# Patient Record
Sex: Female | Born: 1985 | Race: White | Hispanic: No | Marital: Married | Smoking: Former smoker
Health system: Southern US, Community
[De-identification: ages and names within clinical notes are randomized; demographics above are authoritative.]

## PROBLEM LIST (undated history)

## (undated) DIAGNOSIS — N83202 Unspecified ovarian cyst, left side: Secondary | ICD-10-CM

## (undated) DIAGNOSIS — E669 Obesity, unspecified: Secondary | ICD-10-CM

## (undated) DIAGNOSIS — L732 Hidradenitis suppurativa: Secondary | ICD-10-CM

## (undated) DIAGNOSIS — F419 Anxiety disorder, unspecified: Secondary | ICD-10-CM

## (undated) HISTORY — DX: Unspecified ovarian cyst, left side: N83.202

## (undated) HISTORY — DX: Anxiety disorder, unspecified: F41.9

## (undated) HISTORY — DX: Hidradenitis suppurativa: L73.2

## (undated) HISTORY — DX: Obesity, unspecified: E66.9

---

## 2006-05-04 ENCOUNTER — Ambulatory Visit: Payer: Self-pay | Admitting: Internal Medicine

## 2007-02-09 IMAGING — CR DG CHEST 2V
1 series · 2 of 2 positions shown · non-contrast
Comparison: none

REASON FOR EXAM: Shortness of breath
COMMENTS:

PROCEDURE:     DXR - DXR CHEST PA (OR AP) AND LATERAL  - May 04, 2006  [DATE]
RESULT:     PA and lateral view reveals the cardiomediastinal structures to
be within normal limits.  The lung fields are clear.  The vascularity is
within normal limits.  No effusions or pneumothoraces.

[Series 1: view not recorded · 0.17mm/px · 2 of 2 slices shown]
[im 1/2]
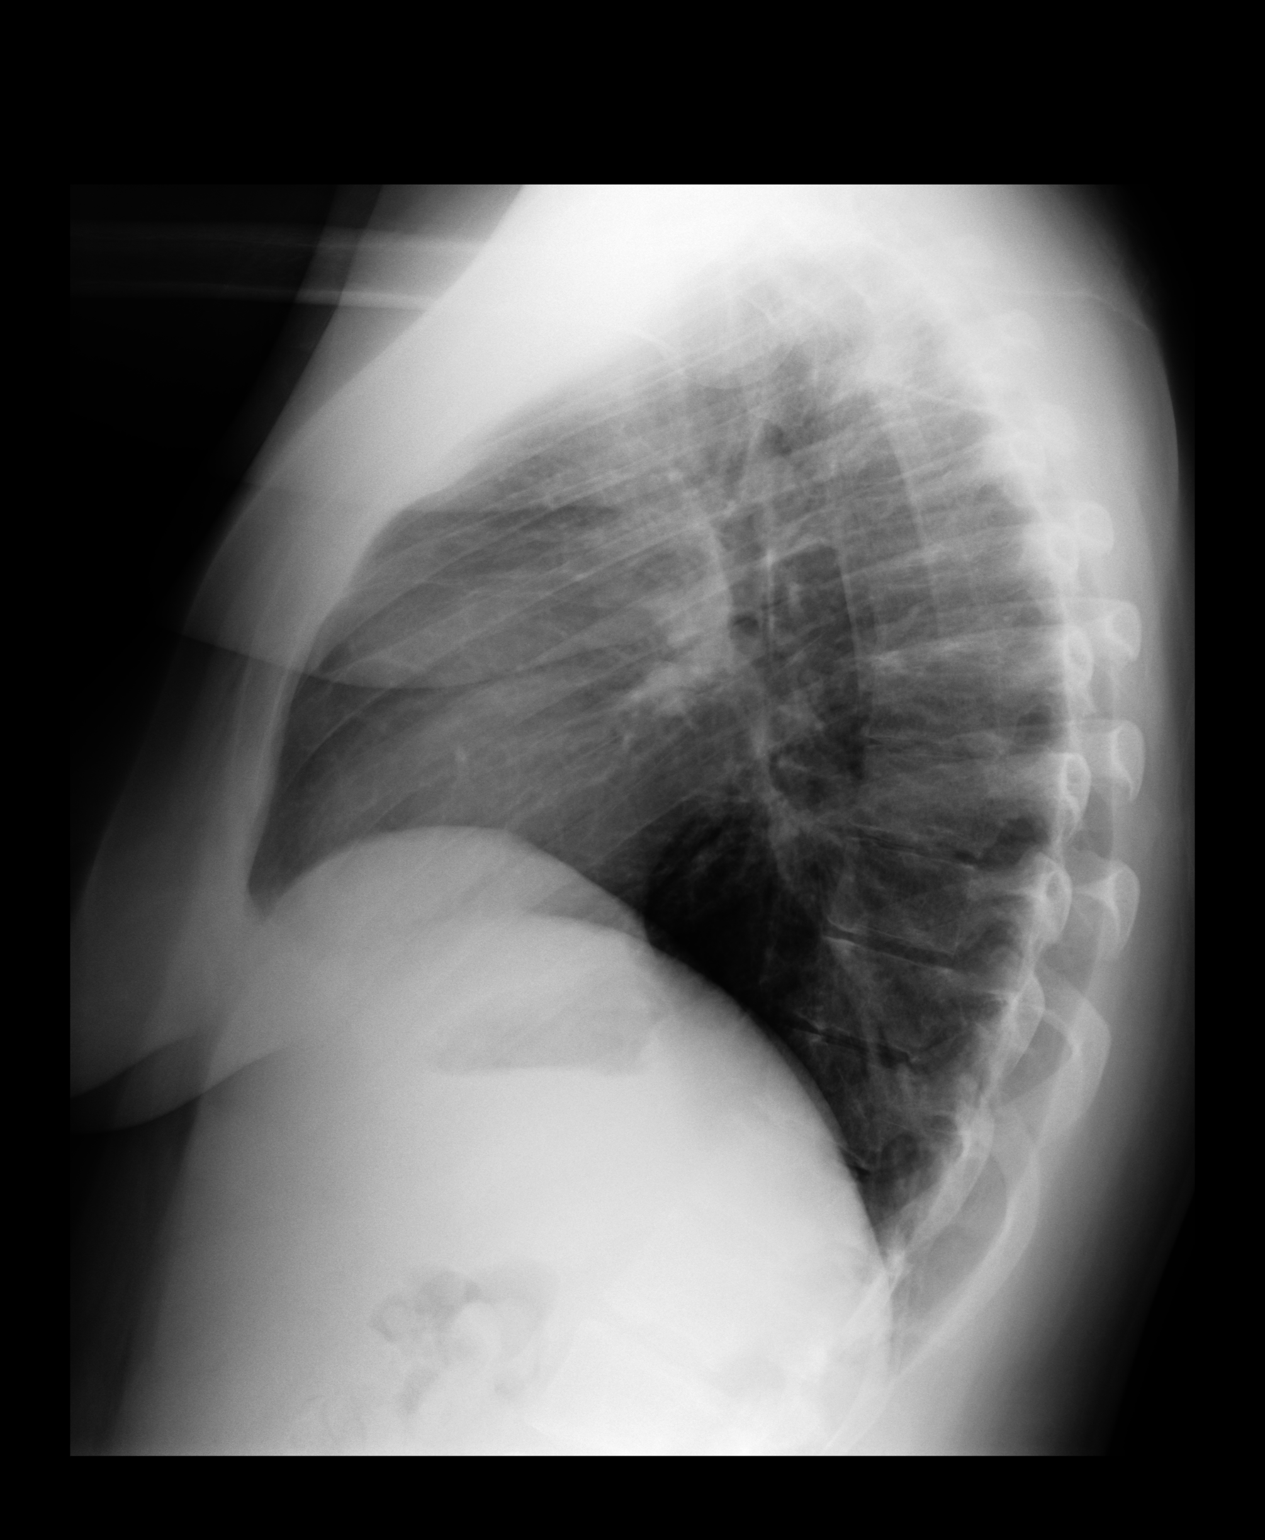
[im 2/2]
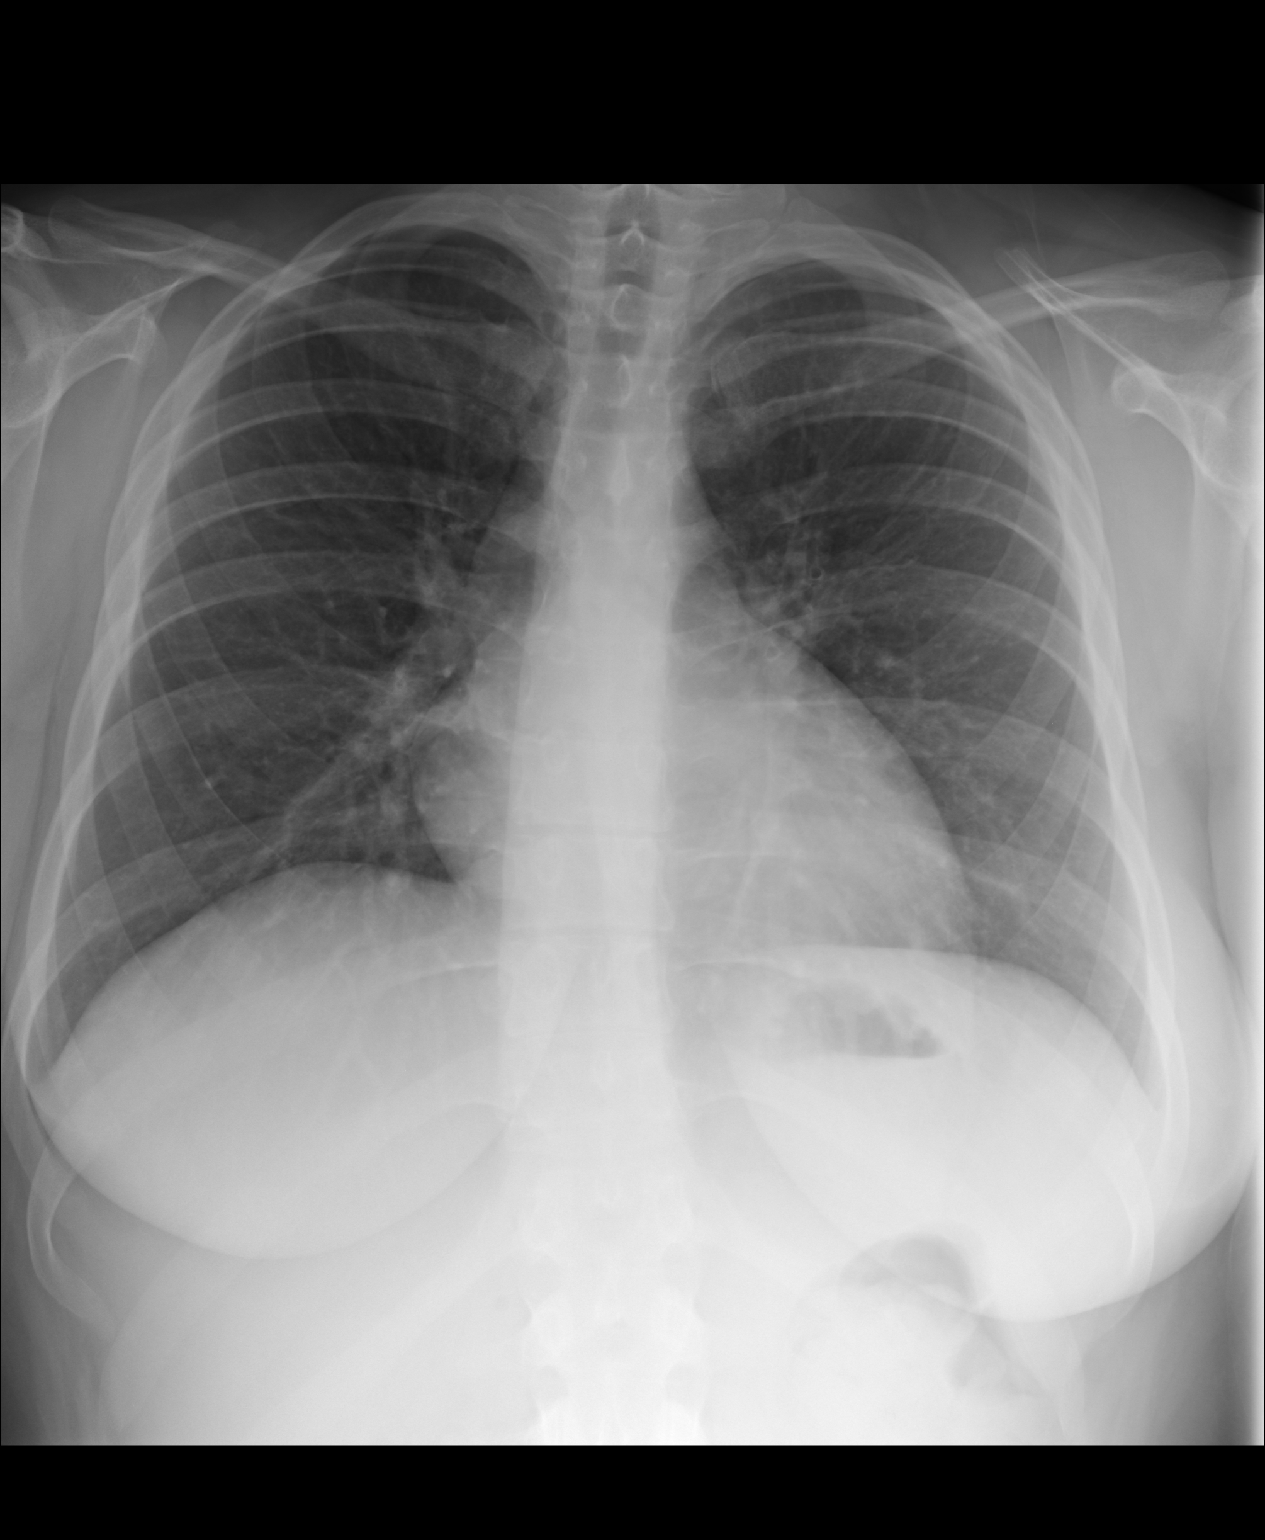

[2 of 2 positions shown; findings below may reference images not displayed]

IMPRESSION: No significant abnormalities are seen of the chest.

## 2009-10-05 HISTORY — PX: CHOLECYSTECTOMY, LAPAROSCOPIC: SHX56

## 2010-11-12 ENCOUNTER — Ambulatory Visit: Payer: Self-pay | Admitting: Emergency Medicine

## 2010-11-13 ENCOUNTER — Ambulatory Visit: Payer: Self-pay | Admitting: Emergency Medicine

## 2011-08-20 IMAGING — US ABDOMEN ULTRASOUND
1 series · 17 of 25 positions shown · non-contrast
Comparison: none

REASON FOR EXAM: abd pain possible gallstones  CR  [phone]   [id number]
COMMENTS:

[Series 1: abdomen ultrasound · 17 of 44 slices shown]
[im 1/44]
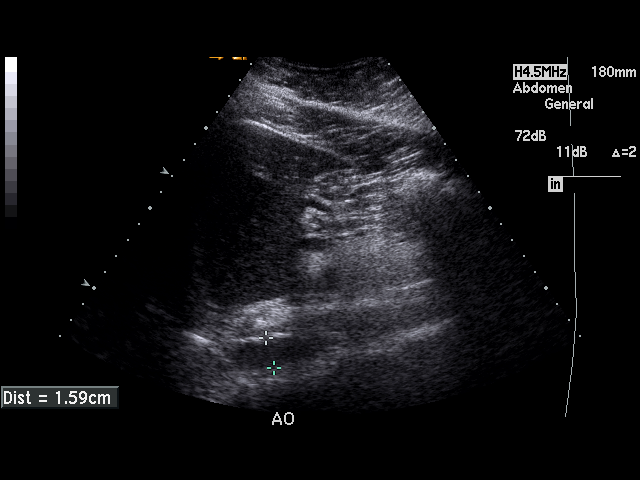
[im 4/44]
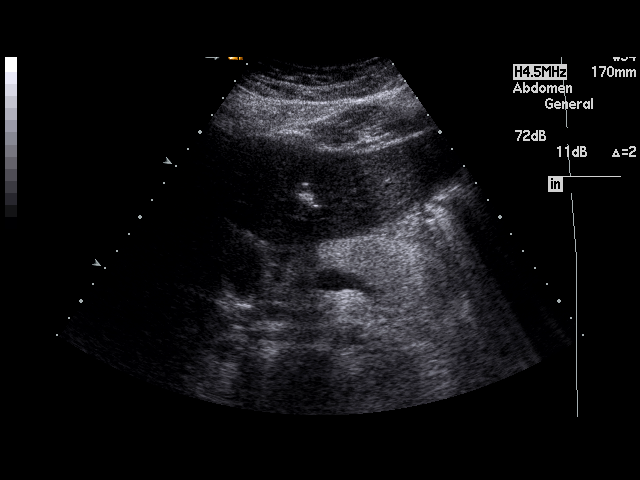
[im 6/44]
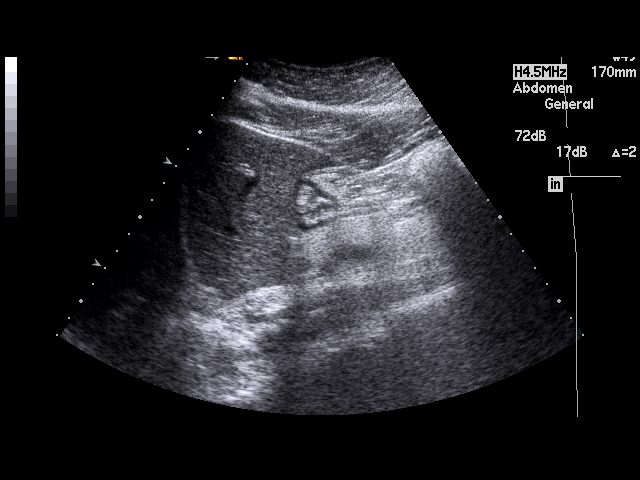
[im 9/44]
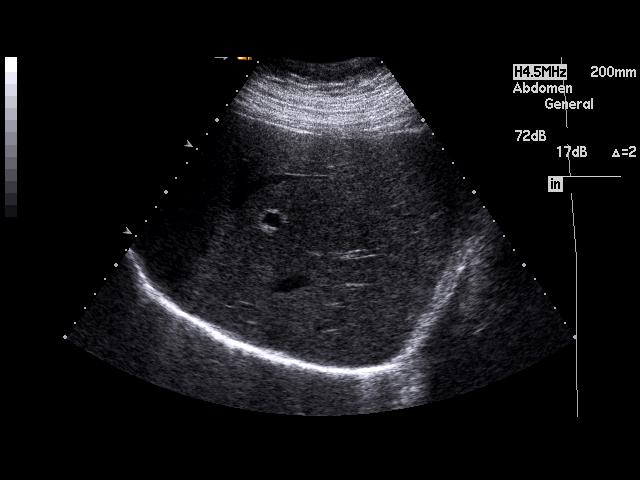
[im 11/44]
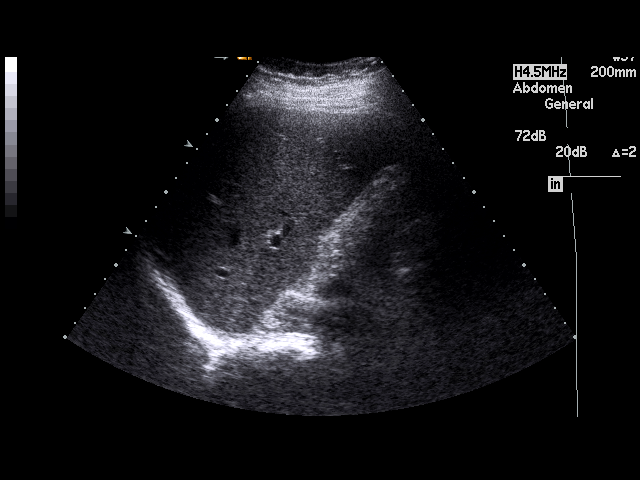
[im 15/44]
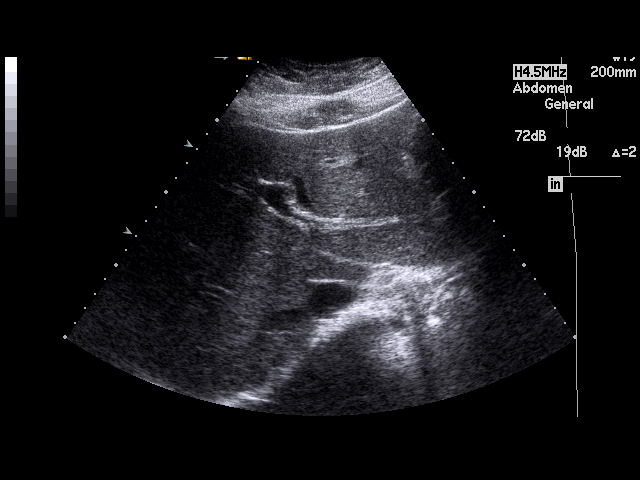
[im 17/44]
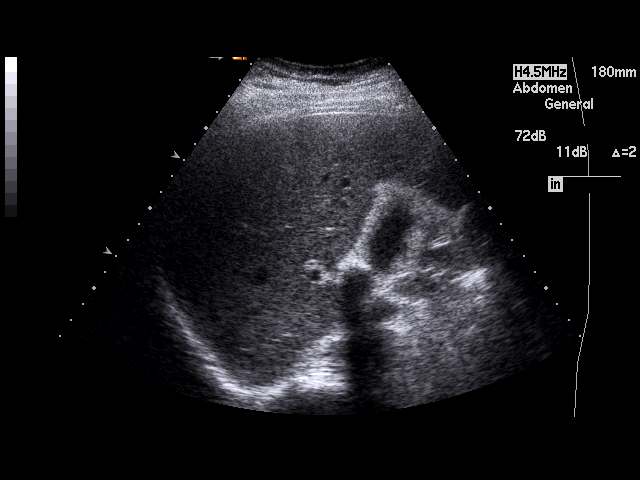
[im 20/44]
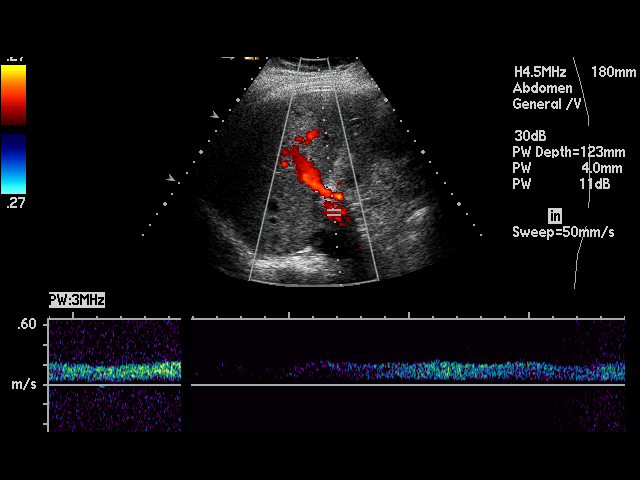
[im 22/44]
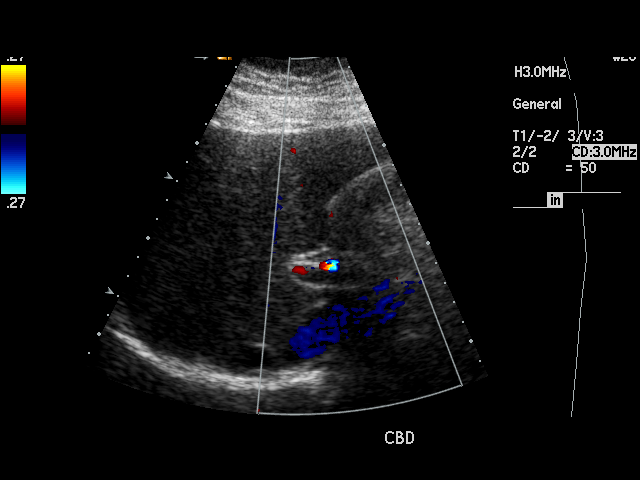
[im 24/44]
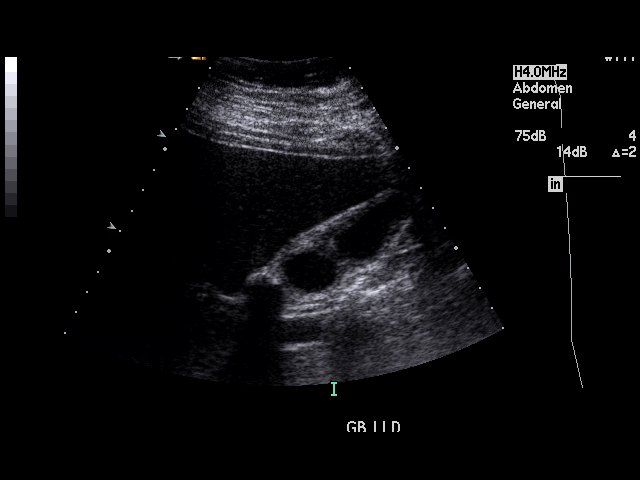
[im 27/44]
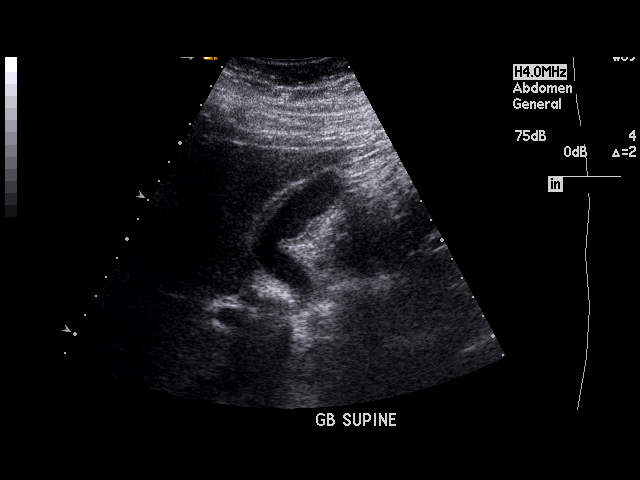
[im 29/44]
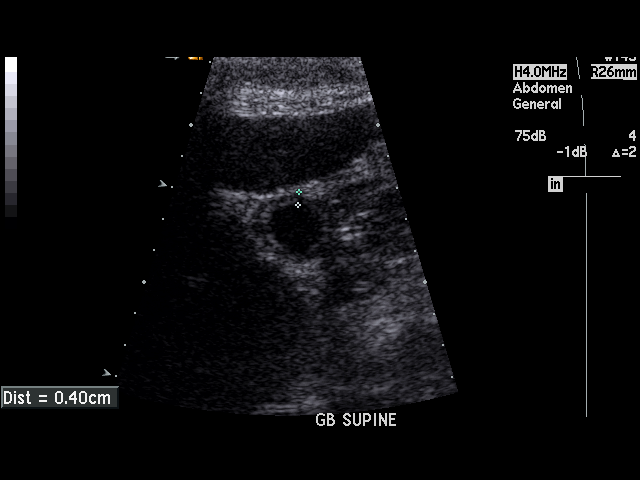
[im 33/44]
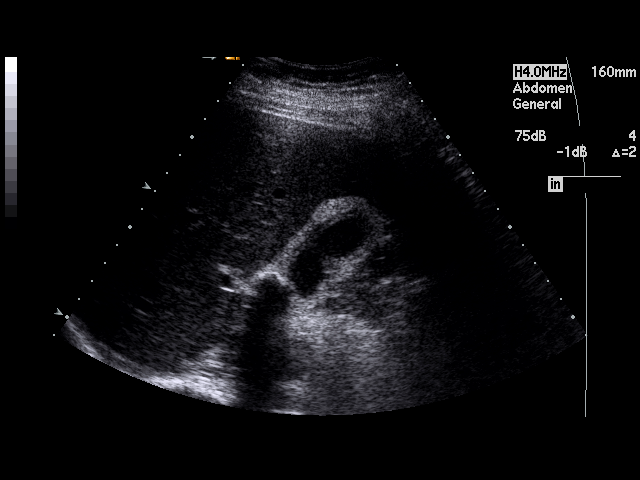
[im 35/44]
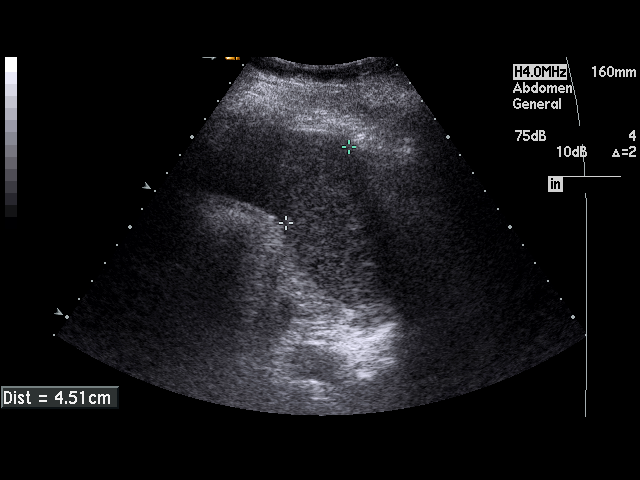
[im 38/44]
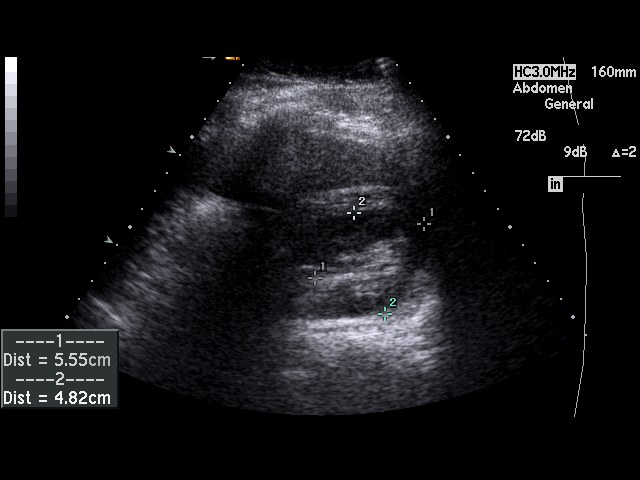
[im 40/44]
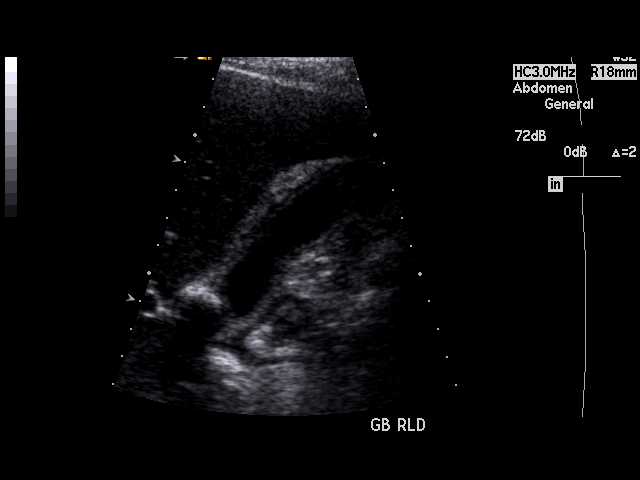
[im 44/44]
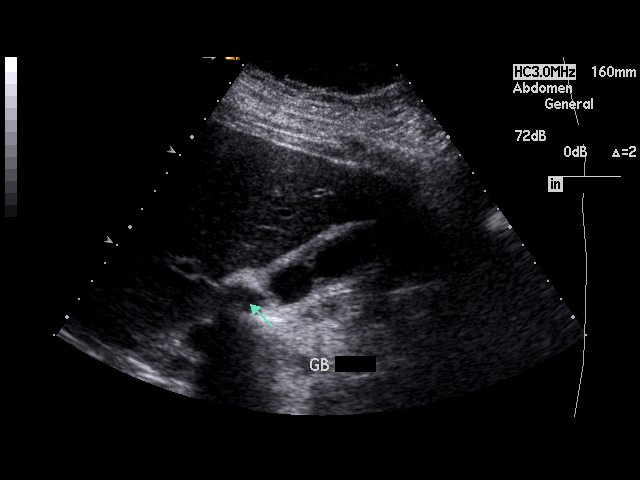

[17 of 25 positions shown; findings below may reference images not displayed]

PROCEDURE:     US  - US ABDOMEN GENERAL SURVEY  - November 12, 2010  [DATE]

RESULT:     Ultrasound of the abdomen is performed in the standard fashion
area study demonstrates what appears to be an impacted stone with shadowing
in the neck of the gallbladder. This did not change position with changes in
patient position during the exam. The gallbladder wall is thickened to
mm. There was not, however, a sonographic Murphy's sign. Correlate for acute
cholecystitis with cholelithiasis.

There is limited visualization of the pancreas because of overlying bowel
gas. The hepatic echotexture is normal. The portal venous flow is normal.
The common bile duct diameter 4.2 mm. The right kidney length is 10.2 cm.
The left kidney length is 10.6 cm. The spleen, aorta and inferior vena cava
appear unremarkable.
IMPRESSION: Findings suggestive of cholelithiasis acute cholecystitis.
There appears to be an impacted gallbladder stone in the gallbladder neck.
Other findings as above.(*)

## 2012-01-03 ENCOUNTER — Ambulatory Visit: Payer: Self-pay | Admitting: Family Medicine

## 2013-05-02 LAB — HM PAP SMEAR: HM Pap smear: NORMAL

## 2013-05-15 ENCOUNTER — Ambulatory Visit: Payer: Self-pay | Admitting: Obstetrics and Gynecology

## 2013-08-30 ENCOUNTER — Ambulatory Visit: Payer: Self-pay | Admitting: Obstetrics and Gynecology

## 2013-08-30 LAB — HEMOGLOBIN: HGB: 12.9 g/dL (ref 12.0–16.0)

## 2013-09-04 ENCOUNTER — Ambulatory Visit: Payer: Self-pay | Admitting: Obstetrics and Gynecology

## 2013-09-04 HISTORY — PX: HYSTEROSCOPY: SHX211

## 2013-09-04 HISTORY — PX: DILATION AND CURETTAGE OF UTERUS: SHX78

## 2013-09-05 LAB — PATHOLOGY REPORT

## 2013-11-03 ENCOUNTER — Ambulatory Visit: Payer: Self-pay | Admitting: Physician Assistant

## 2014-02-20 IMAGING — US TRANSABDOMINAL ULTRASOUND OF PELVIS
1 series · 13 of 25 positions shown · non-contrast
Comparison: none

REASON FOR EXAM: dysfunctional  uterine bleeding
COMMENTS:

[Series 1: transabdominal ultrasound of pelvis · 0.28mm/px · 13 of 108 slices shown]
[im 1/108]
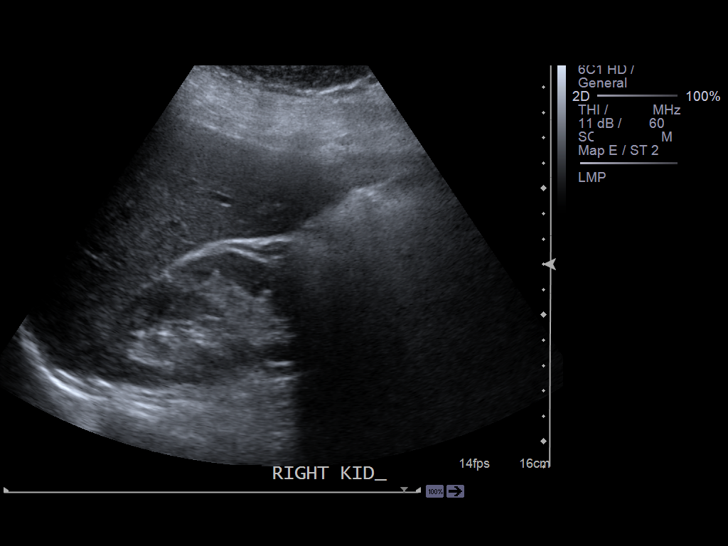
[im 9/108]
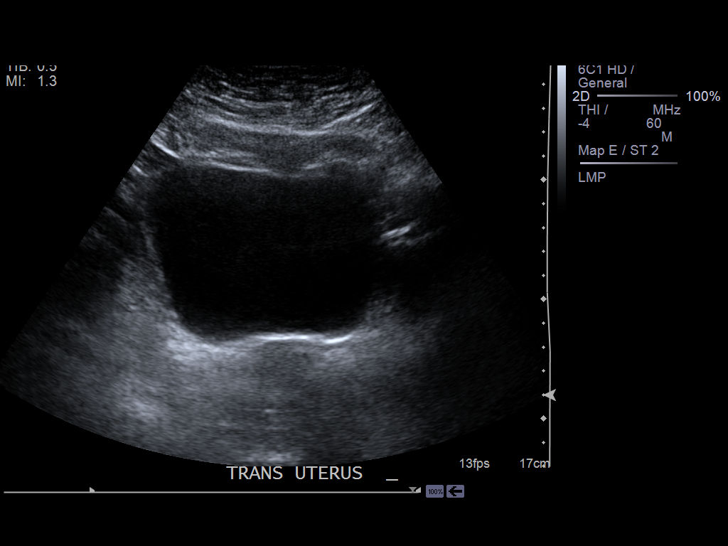
[im 18/108]
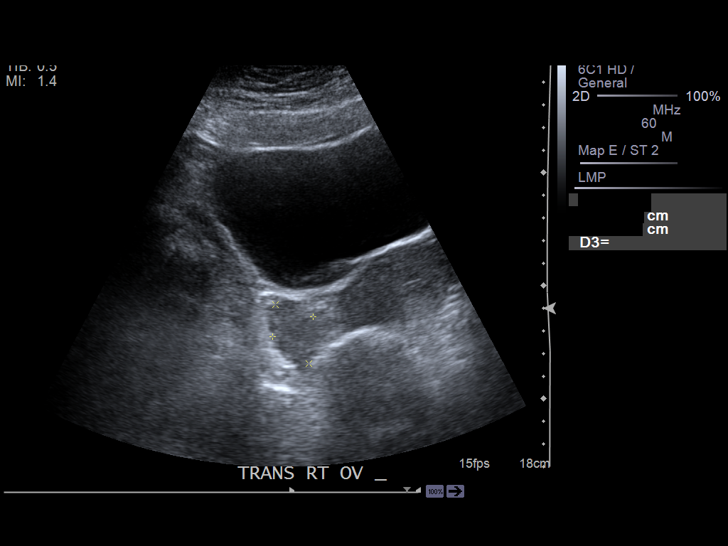
[im 27/108]
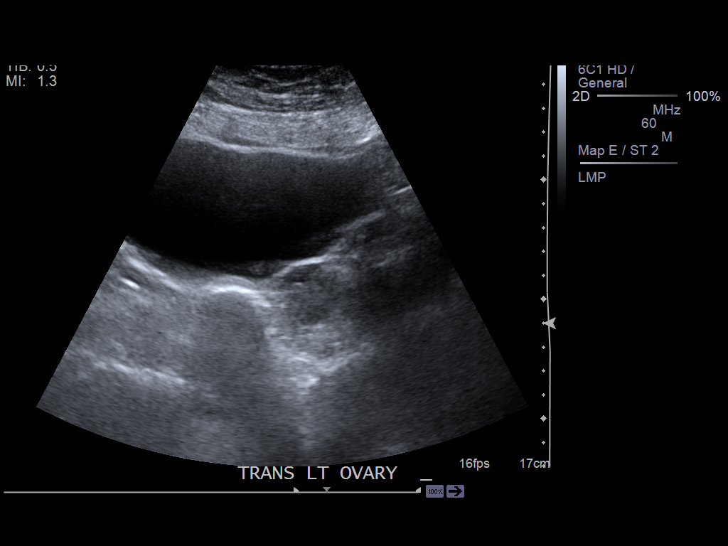
[im 36/108]
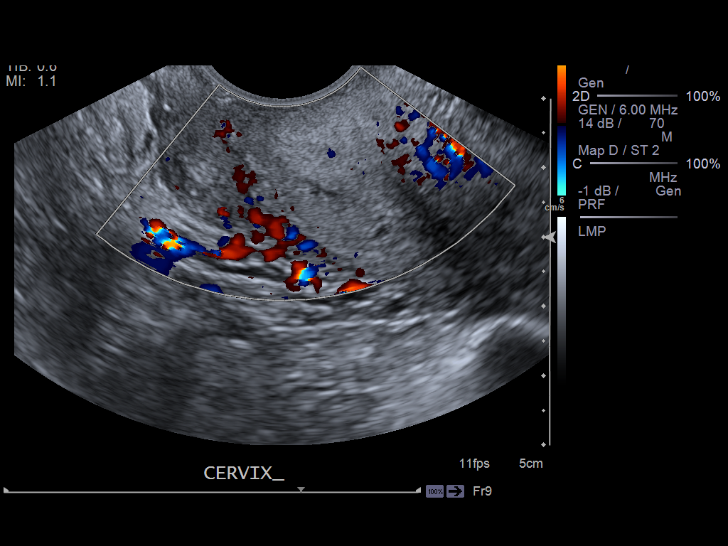
[im 45/108]
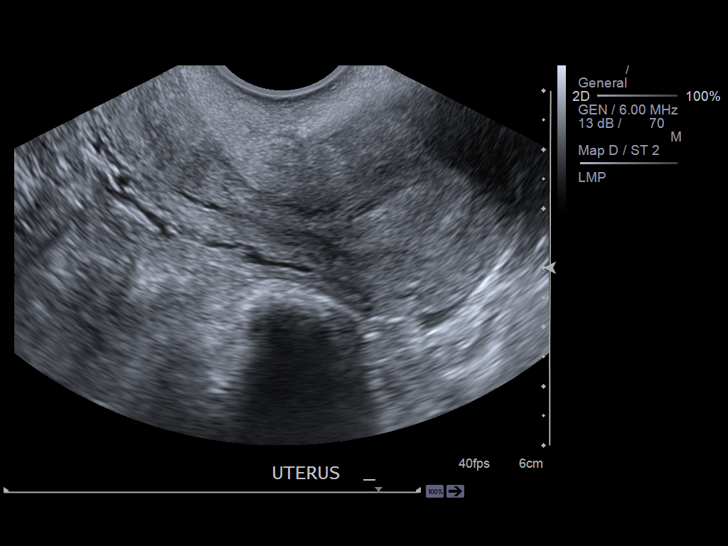
[im 54/108]
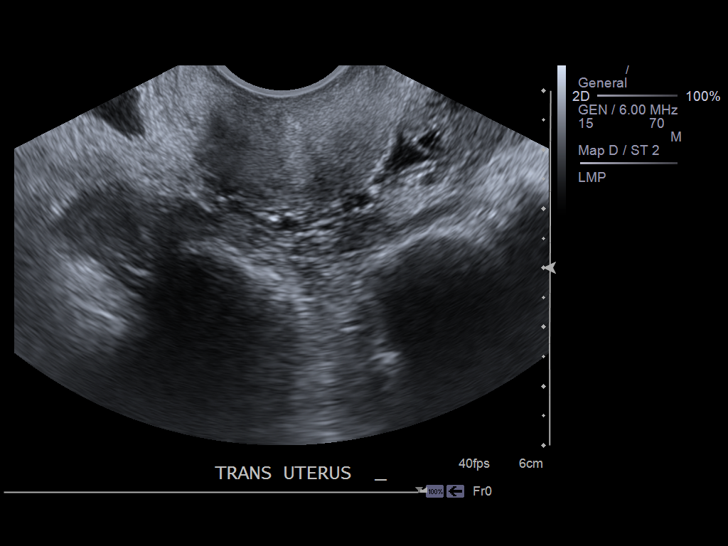
[im 63/108]
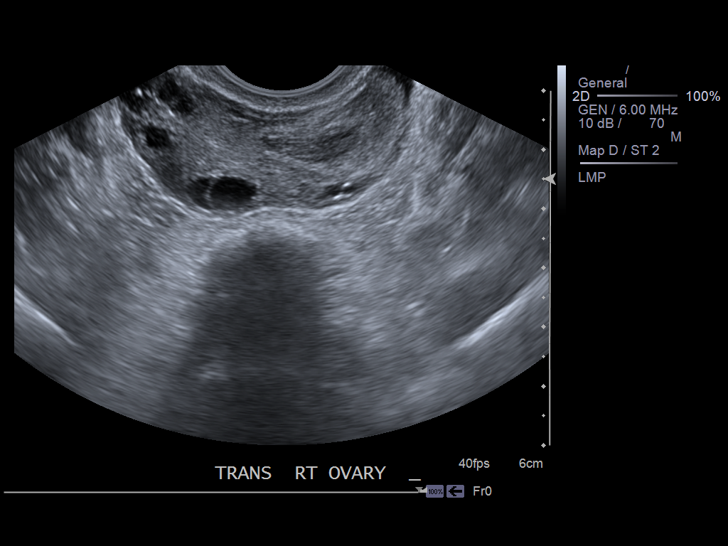
[im 72/108]
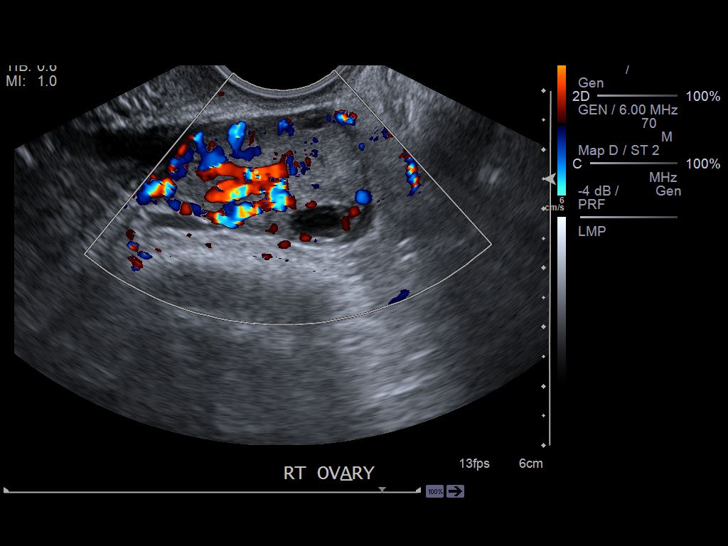
[im 81/108]
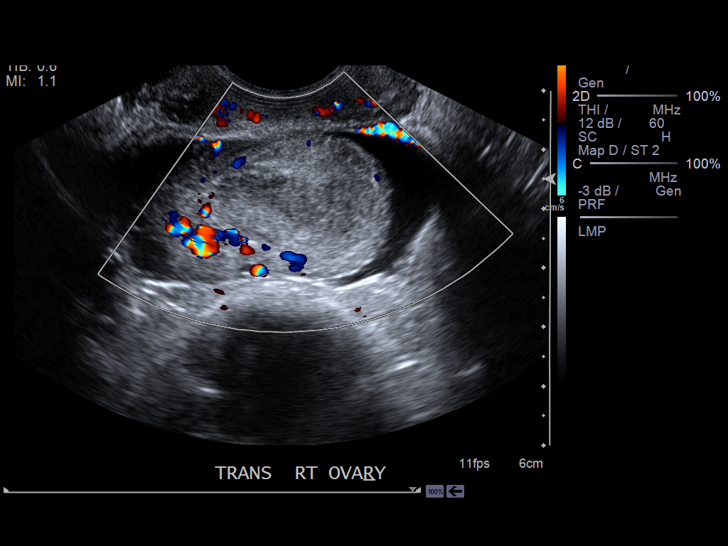
[im 90/108]
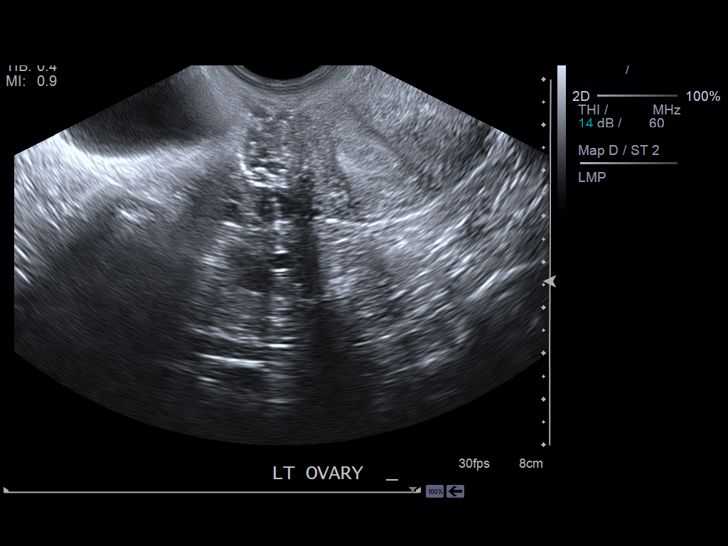
[im 99/108]
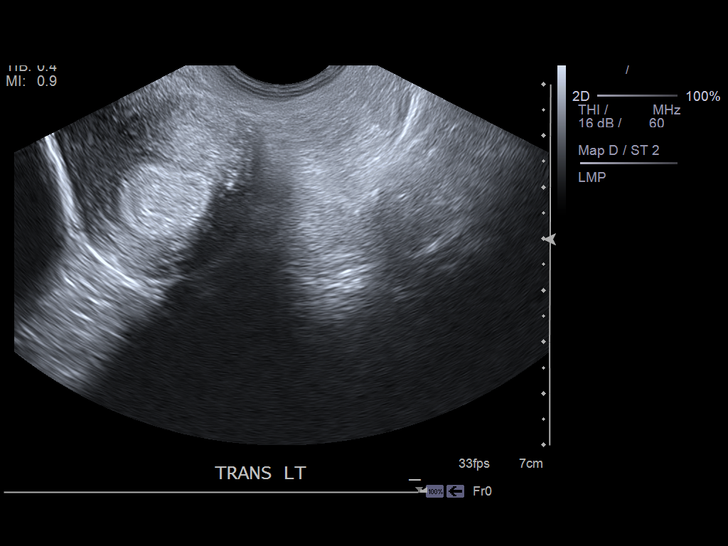
[im 108/108]
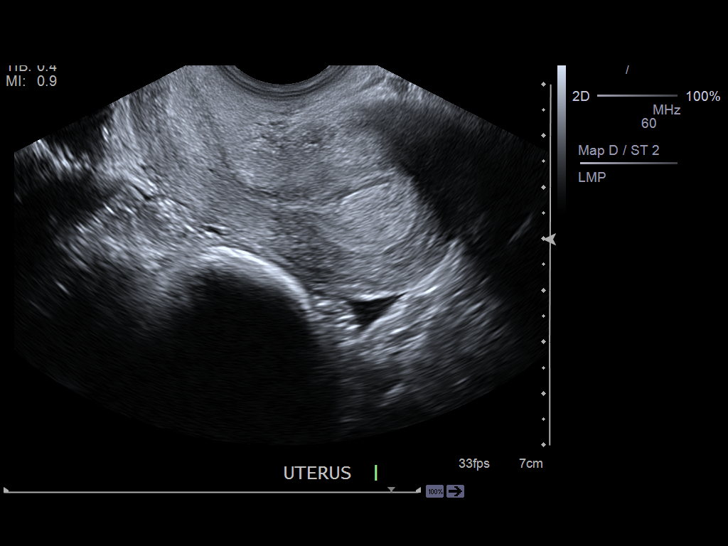

[13 of 25 positions shown; findings below may reference images not displayed]

PROCEDURE:     GEDOR - GEDOR PELVIS NON-OB W/TRANSVAGINAL  - May 15, 2013  [DATE]

RESULT:     A transabdominal and endovaginal pelvic sonogram is performed.
The patient has no previous exam for comparison. The kidneys appear to be
grossly normal on survey images. The uterus measures 8.18 x 4.94 x 3.71 cm.
There is a moderate amount of free fluid in the rectouterine cul-de-sac. The
endometrium measures 1.33 cm thick. The right ovary measures 4.74 x 2.05 x
3.40 cm. The left ovary measures 2.79 x 2.20 x 2.62 cm. No myometrial mass
is evident. There is a trace of fluid noted in the cervix. There is
increased vascularity in the right adnexa. The right ovary shows a mildly
hyperechoic lesion measuring 3.04 x 1.88 x 3.03 cm. This appear to have some
flow within it. There is relative hypoechogenicity in the central portion of
this. This could represent an endometrioma or other heterogeneous solid
lesion. Correlate clinically. The left ovary appears unremarkable.
IMPRESSION: 1. Moderate amount of free fluid. Abnormal mixed predominately hypoechoic
lesion in the right ovary with some surrounding fluid and increased blood
flow. Is there history of endometriosis?

[REDACTED]

## 2014-10-07 ENCOUNTER — Inpatient Hospital Stay: Payer: Self-pay | Admitting: Obstetrics and Gynecology

## 2014-10-07 LAB — CBC WITH DIFFERENTIAL/PLATELET
BASOS ABS: 0 10*3/uL (ref 0.0–0.1)
BASOS PCT: 0.4 %
Eosinophil #: 0.1 10*3/uL (ref 0.0–0.7)
Eosinophil %: 0.7 %
HCT: 37.7 % (ref 35.0–47.0)
HGB: 12.3 g/dL (ref 12.0–16.0)
LYMPHS ABS: 2.5 10*3/uL (ref 1.0–3.6)
LYMPHS PCT: 21.7 %
MCH: 31.1 pg (ref 26.0–34.0)
MCHC: 32.7 g/dL (ref 32.0–36.0)
MCV: 95 fL (ref 80–100)
MONOS PCT: 5.6 %
Monocyte #: 0.7 x10 3/mm (ref 0.2–0.9)
Neutrophil #: 8.4 10*3/uL — ABNORMAL HIGH (ref 1.4–6.5)
Neutrophil %: 71.6 %
Platelet: 324 10*3/uL (ref 150–440)
RBC: 3.96 10*6/uL (ref 3.80–5.20)
RDW: 13.7 % (ref 11.5–14.5)
WBC: 11.7 10*3/uL — ABNORMAL HIGH (ref 3.6–11.0)

## 2014-10-09 LAB — HEMATOCRIT: HCT: 32.1 % — ABNORMAL LOW (ref 35.0–47.0)

## 2015-01-25 NOTE — Op Note (Signed)
PATIENT NAME:  Jacqueline Abbott, Maylynn A MR#:  [id number] DATE OF BIRTH:  Feb 04, 1986  DATE OF PROCEDURE:  09/04/2013  PREOPERATIVE DIAGNOSES:  1.  Heavy menstrual bleeding. 2.  Endometrial polyp. 3.  Septate uterus.  POSTOPERATIVE DIAGNOSES:  1.  Heavy menstrual bleeding. 2.  Endometrial polyp. 3.  Septate uterus.  PROCEDURE PERFORMED:  1.  Hysteroscopy. 2.  Dilation and curettage with endometrial polypectomy.  ANESTHESIA: General endotracheal.  SURGEON: Hildred LaserAnika Alonie Gazzola, MD   ESTIMATED BLOOD LOSS: Minimal.  OPERATIVE FLUIDS: 100 mL of normal saline.  URINE OUTPUT: 150 mL.  COMPLICATIONS: None.  FINDINGS: Proliferative endometrium, suspected endometrial polyp on right lateral uterine wall that was approximately 1 to 2 cm.  There was a thickened anterior uterine wall near fundus, likely secondary to septation.  SPECIMENS: Endometrial curettings and suspected endometrial polyp.  PROCEDURE: The patient was taken to the Operating Room, where she was placed under general anesthesia without difficulty. She was then placed in the dorsal lithotomy position, using candy-cane stirrups. She was prepped and draped in the normal sterile fashion. A straight catheterization was performed. Next, a sterile speculum was inserted into the vagina. A single-tooth tenaculum was used to grasp the anterior lip of the cervix. Cervical dilation was performed. Next, the cavity was gently sounded to approximately 9 cm.  A 5 mm hysteroscope was then introduced into the uterus under direct visualization. The cavity was allowed to fill, and then the entire cavity was explored, with the findings described above. The hysteroscope was then removed, and a gentle sharp curettage was then passed into the uterus for endometrial sampling and removal of the suspected polyp.  After this, the hysteroscope was then reinserted into the uterine cavity and allowed to fill once more. The endometrial polyp was able to be better  visualized, and then the hysteroscope was removed from the uterine cavity and polyp forceps were then inserted into the uterine cavity in an attempt to grasp the endometrial polyp. After the polyp forceps were removed, the hysteroscope was then introduced once more into the uterine cavity. No evidence of the endometrial polyp was noted at this time.  The hysteroscope was once again removed from the uterine cavity. The tenaculum was removed from the anterior portion of the cervix, and excellent hemostasis was noted. The speculum was then removed from the patient's vagina. Instrument and sponge counts were correct x2. The patient was taken to the recovery room in stable condition.    ____________________________ Jacques EarthlyAnika S. Valentino Saxonherry, MD asc:cg D: 09/04/2013 17:39:01 ET T: 09/04/2013 23:29:51 ET JOB#: [id number]  cc: Jacques EarthlyAnika S. Valentino Saxonherry, MD, <Dictator> Fabian NovemberANIKA S Teshawn Moan MD ELECTRONICALLY SIGNED 09/05/2013 16:24

## 2015-01-29 DIAGNOSIS — E7800 Pure hypercholesterolemia, unspecified: Secondary | ICD-10-CM

## 2015-01-29 DIAGNOSIS — L732 Hidradenitis suppurativa: Secondary | ICD-10-CM | POA: Insufficient documentation

## 2015-01-29 DIAGNOSIS — K219 Gastro-esophageal reflux disease without esophagitis: Secondary | ICD-10-CM | POA: Insufficient documentation

## 2015-01-29 DIAGNOSIS — F419 Anxiety disorder, unspecified: Secondary | ICD-10-CM

## 2015-02-12 NOTE — H&P (Signed)
L&D Evaluation:  History:  HPI 29 yo G1P0, estimated date of confinement 09/30/2014, EGA 41.0 weeks admitted for Cytotec/Pitocin IOL.   Patient's Medical History Increased BMI; Anxiety;Tobacco user; Hidradenitisl;GERD   Patient's Surgical History D&C   Medications Pre Natal Vitamins  Zyrtec   Allergies NKDA   Social History tobacco  1 PPD   Family History Non-Contributory   ROS:  ROS All systems were reviewed.  HEENT, CNS, GI, GU, Respiratory, CV, Renal and Musculoskeletal systems were found to be normal.   Exam:  Vital Signs stable   Urine Protein not completed   General no apparent distress   Mental Status clear   Heart normal sinus rhythm   Abdomen gravid, non-tender   Estimated Fetal Weight Average for gestational age, 8 1/2 pounds   Back no CVAT   Edema 1+   Pelvic no external lesions, FT/90/-3/VTX/BOWI   FHT normal rate with no decels   Ucx irregular   Skin dry   Lymph no lymphadenopathy   Other O+/ATB-/NR/RI/VI/HIV-/GBS-   Impression:  Impression 41 week Intrauterine pregnancy   Plan:  Plan Cytotec/Pitocin IOL   Electronic Signatures: DeFrancesco, Prentice DockerMartin A (MD)  (Signed 04-Jan-16 06:05)  Authored: L&D Evaluation   Last Updated: 04-Jan-16 06:05 by DeFrancesco, Prentice DockerMartin A (MD)

## 2015-03-22 ENCOUNTER — Encounter: Payer: Self-pay | Admitting: Obstetrics and Gynecology

## 2015-04-12 ENCOUNTER — Ambulatory Visit (INDEPENDENT_AMBULATORY_CARE_PROVIDER_SITE_OTHER): Payer: No Typology Code available for payment source | Admitting: Obstetrics and Gynecology

## 2015-04-12 ENCOUNTER — Encounter: Payer: Self-pay | Admitting: Obstetrics and Gynecology

## 2015-04-12 VITALS — BP 121/84 | HR 92 | Ht 67.0 in | Wt 277.3 lb

## 2015-04-12 DIAGNOSIS — Z01419 Encounter for gynecological examination (general) (routine) without abnormal findings: Secondary | ICD-10-CM | POA: Diagnosis not present

## 2015-04-12 MED ORDER — NORGESTIMATE-ETH ESTRADIOL 0.25-35 MG-MCG PO TABS: 1.0000 | ORAL_TABLET | Freq: Every day | ORAL | Status: DC

## 2015-04-12 MED ORDER — CITALOPRAM HYDROBROMIDE 20 MG PO TABS: 20.0000 mg | ORAL_TABLET | Freq: Every day | ORAL | Status: DC

## 2015-04-12 MED ORDER — CYANOCOBALAMIN 1000 MCG/ML IJ SOLN: 1000.0000 ug | Freq: Once | INTRAMUSCULAR | Status: DC

## 2015-04-12 NOTE — Progress Notes (Signed)
  Subjective:     Jacqueline Abbott is a 29 y.o. female and is here for a comprehensive physical exam. The patient reports problems - depression worse in last month- desires restart medication; also weight has went up due to depression.  History   Social History  . Marital Status: Married    Spouse Name: N/A  . Number of Children: N/A  . Years of Education: N/A   Occupational History  . Not on file.   Social History Main Topics  . Smoking status: Current Every Day Smoker    Types: Cigarettes  . Smokeless tobacco: Never Used  . Alcohol Use: No  . Drug Use: No  . Sexual Activity: Yes    Birth Control/ Protection: Pill   Other Topics Concern  . Not on file   Social History Narrative   Health Maintenance  Topic Date Due  . HIV Screening  12/30/2001  . PAP SMEAR  12/30/2004  . TETANUS/TDAP  12/30/2005  . INFLUENZA VACCINE  05/06/2015    The following portions of the patient's history were reviewed and updated as appropriate: allergies, current medications, past family history, past medical history, past social history, past surgical history and problem list.  Review of Systems A comprehensive review of systems was negative except for: Behavioral/Psych: positive for anxiety, depression and obesity   Objective:    General appearance: alert, cooperative and morbidly obese Throat: lips, mucosa, and tongue normal; teeth and gums normal Lungs: clear to auscultation bilaterally Breasts: normal appearance, no masses or tenderness Heart: regular rate and rhythm, S1, S2 normal, no murmur, click, rub or gallop Abdomen: soft, non-tender; bowel sounds normal; no masses,  no organomegaly Pelvic: cervix normal in appearance, external genitalia normal, no adnexal masses or tenderness, no cervical motion tenderness, rectovaginal septum normal, uterus normal size, shape, and consistency and vagina normal without discharge Extremities: extremities normal, atraumatic, no cyanosis or  edema Neurologic: Alert and oriented X 3, normal strength and tone. Normal symmetric reflexes. Normal coordination and gait    Assessment:    Healthy female exam. Depression; morbid obesity; smoker;      Plan:  Routine screening labs and pap. RTC 1 year for AE  Restart celexa at 20mg  daily.  RTC in 2 weeks to start weight loss management program, B12 rx sent today and will give Adipex then, already counseled.  Smoking cessation encouraged, she will let me know when she is ready to start and I will prescribe Nicotrol patches.  Melody Ines BloomerBurr, CNM   See After Visit Summary for Counseling Recommendations

## 2015-04-12 NOTE — Patient Instructions (Signed)
Exercise to Lose Weight Exercise and a healthy diet may help you lose weight. Your doctor may suggest specific exercises. EXERCISE IDEAS AND TIPS  Choose low-cost things you enjoy doing, such as walking, bicycling, or exercising to workout videos.  Take stairs instead of the elevator.  Walk during your lunch break.  Park your car further away from work or school.  Go to a gym or an exercise class.  Start with 5 to 10 minutes of exercise each day. Build up to 30 minutes of exercise 4 to 6 days a week.  Wear shoes with good support and comfortable clothes.  Stretch before and after working out.  Work out until you breathe harder and your heart beats faster.  Drink extra water when you exercise.  Do not do so much that you hurt yourself, feel dizzy, or get very short of breath. Exercises that burn about 150 calories:  Running 1  miles in 15 minutes.  Playing volleyball for 45 to 60 minutes.  Washing and waxing a car for 45 to 60 minutes.  Playing touch football for 45 minutes.  Walking 1  miles in 35 minutes.  Pushing a stroller 1  miles in 30 minutes.  Playing basketball for 30 minutes.  Raking leaves for 30 minutes.  Bicycling 5 miles in 30 minutes.  Walking 2 miles in 30 minutes.  Dancing for 30 minutes.  Shoveling snow for 15 minutes.  Swimming laps for 20 minutes.  Walking up stairs for 15 minutes.  Bicycling 4 miles in 15 minutes.  Gardening for 30 to 45 minutes.  Jumping rope for 15 minutes.  Washing windows or floors for 45 to 60 minutes. Document Released: 10/24/2010 Document Revised: 12/14/2011 Document Reviewed: 10/24/2010 [facility] Patient Information 2015 [place], [place]. This information is not intended to replace advice given to you by your health care provider. Make sure you discuss any questions you have with your health care provider.   Depression Depression refers to feeling sad, low, down in the dumps, blue, gloomy, or empty. In  general, there are two kinds of depression:  Normal sadness or normal grief. This kind of depression is one that we all feel from time to time after upsetting life experiences, such as the loss of a job or the ending of a relationship. This kind of depression is considered normal, is short lived, and resolves within a few days to 2 weeks. Depression experienced after the loss of a loved one (bereavement) often lasts longer than 2 weeks but normally gets better with time.  Clinical depression. This kind of depression lasts longer than normal sadness or normal grief or interferes with your ability to function at home, at work, and in school. It also interferes with your personal relationships. It affects almost every aspect of your life. Clinical depression is an illness. Symptoms of depression can also be caused by conditions other than those mentioned above, such as:  Physical illness. Some physical illnesses, including underactive thyroid gland (hypothyroidism), severe anemia, specific types of cancer, diabetes, uncontrolled seizures, heart and lung problems, strokes, and chronic pain are commonly associated with symptoms of depression.  Side effects of some prescription medicine. In some people, certain types of medicine can cause symptoms of depression.  Substance abuse. Abuse of alcohol and illicit drugs can cause symptoms of depression. SYMPTOMS Symptoms of normal sadness and normal grief include the following:  Feeling sad or crying for short periods of time.  Not caring about anything (apathy).  Difficulty sleeping or sleeping  too much.  No longer able to enjoy the things you used to enjoy.  Desire to be by oneself all the time (social isolation).  Lack of energy or motivation.  Difficulty concentrating or remembering.  Change in appetite or weight.  Restlessness or agitation. Symptoms of clinical depression include the same symptoms of normal sadness or normal grief and also  the following symptoms:  Feeling sad or crying all the time.  Feelings of guilt or worthlessness.  Feelings of hopelessness or helplessness.  Thoughts of suicide or the desire to harm yourself (suicidal ideation).  Loss of touch with reality (psychotic symptoms). Seeing or hearing things that are not real (hallucinations) or having false beliefs about your life or the people around you (delusions and paranoia). DIAGNOSIS  The diagnosis of clinical depression is usually based on how bad the symptoms are and how long they have lasted. Your health care provider will also ask you questions about your medical history and substance use to find out if physical illness, use of prescription medicine, or substance abuse is causing your depression. Your health care provider may also order blood tests. TREATMENT  Often, normal sadness and normal grief do not require treatment. However, sometimes antidepressant medicine is given for bereavement to ease the depressive symptoms until they resolve. The treatment for clinical depression depends on how bad the symptoms are but often includes antidepressant medicine, counseling with a mental health professional, or both. Your health care provider will help to determine what treatment is best for you. Depression caused by physical illness usually goes away with appropriate medical treatment of the illness. If prescription medicine is causing depression, talk with your health care provider about stopping the medicine, decreasing the dose, or changing to another medicine. Depression caused by the abuse of alcohol or illicit drugs goes away when you stop using these substances. Some adults need professional help in order to stop drinking or using drugs. SEEK IMMEDIATE MEDICAL CARE IF:  You have thoughts about hurting yourself or others.  You lose touch with reality (have psychotic symptoms).  You are taking medicine for depression and have a serious side  effect. FOR MORE INFORMATION  National Alliance on Mental Illness: www.nami.[employer] of Mental Health: http://www.maynard.net/ Document Released: 09/18/2000 Document Revised: 02/05/2014 Document Reviewed: 12/21/2011 [facility] Patient Information 2015 [place], [place]. This information is not intended to replace advice given to you by your health care provider. Make sure you discuss any questions you have with your health care provider.

## 2015-04-13 LAB — LIPID PANEL
CHOLESTEROL TOTAL: 176 mg/dL (ref 100–199)
Chol/HDL Ratio: 2.6 ratio units (ref 0.0–4.4)
HDL: 67 mg/dL (ref >39)
LDL Calculated: 85 mg/dL (ref 0–99)
TRIGLYCERIDES: 120 mg/dL (ref 0–149)
VLDL Cholesterol Cal: 24 mg/dL (ref 5–40)

## 2015-04-13 LAB — THYROID PANEL WITH TSH
FREE THYROXINE INDEX: 2.2 (ref 1.2–4.9)
T3 Uptake Ratio: 20 % — ABNORMAL LOW (ref 24–39)
T4, Total: 10.9 ug/dL (ref 4.5–12.0)
TSH: 1.17 u[IU]/mL (ref 0.450–4.500)

## 2015-04-13 LAB — COMPREHENSIVE METABOLIC PANEL
A/G RATIO: 1.4 (ref 1.1–2.5)
ALBUMIN: 3.9 g/dL (ref 3.5–5.5)
ALK PHOS: 77 IU/L (ref 39–117)
ALT: 15 IU/L (ref 0–32)
AST: 11 IU/L (ref 0–40)
BUN/Creatinine Ratio: 16 (ref 8–20)
BUN: 13 mg/dL (ref 6–20)
Bilirubin Total: <0.2 mg/dL (ref 0.0–1.2)
CO2: 22 mmol/L (ref 18–29)
Calcium: 9.2 mg/dL (ref 8.7–10.2)
Chloride: 101 mmol/L (ref 97–108)
Creatinine, Ser: 0.8 mg/dL (ref 0.57–1.00)
GFR calc Af Amer: 116 mL/min/{1.73_m2} (ref >59)
GFR calc non Af Amer: 101 mL/min/{1.73_m2} (ref >59)
GLOBULIN, TOTAL: 2.8 g/dL (ref 1.5–4.5)
Glucose: 85 mg/dL (ref 65–99)
Potassium: 4.1 mmol/L (ref 3.5–5.2)
SODIUM: 141 mmol/L (ref 134–144)
Total Protein: 6.7 g/dL (ref 6.0–8.5)

## 2015-04-13 LAB — HEMOGLOBIN A1C
ESTIMATED AVERAGE GLUCOSE: 111 mg/dL
Hgb A1c MFr Bld: 5.5 % (ref 4.8–5.6)

## 2015-04-16 ENCOUNTER — Telehealth: Payer: Self-pay | Admitting: *Deleted

## 2015-04-16 LAB — PAPIG, HPV, RFX 16/18
HPV, high-risk: NEGATIVE
PAP SMEAR COMMENT: 0

## 2015-04-16 NOTE — Telephone Encounter (Signed)
-----   Message from Ulyses AmorMelody N Burr, [place] sent at 04/16/2015  8:06 AM EDT ----- Please let her know all blood test were normal

## 2015-04-16 NOTE — Telephone Encounter (Signed)
Notified pt of normal lab results 

## 2015-04-17 ENCOUNTER — Encounter: Payer: Self-pay | Admitting: *Deleted

## 2015-04-26 ENCOUNTER — Encounter: Payer: Self-pay | Admitting: *Deleted

## 2015-04-26 ENCOUNTER — Ambulatory Visit (INDEPENDENT_AMBULATORY_CARE_PROVIDER_SITE_OTHER): Payer: No Typology Code available for payment source | Admitting: Obstetrics and Gynecology

## 2015-04-26 VITALS — BP 118/74 | HR 82 | Ht 66.0 in | Wt 276.0 lb

## 2015-04-26 DIAGNOSIS — E669 Obesity, unspecified: Secondary | ICD-10-CM

## 2015-04-26 MED ORDER — PHENTERMINE HCL 37.5 MG PO TABS: 37.5000 mg | ORAL_TABLET | Freq: Every day | ORAL | Status: DC

## 2015-04-26 MED ORDER — CYANOCOBALAMIN 1000 MCG/ML IJ SOLN
1000.0000 ug | Freq: Once | INTRAMUSCULAR | Status: AC
  Administered 2015-04-26: 1000 ug via INTRAMUSCULAR

## 2015-04-26 NOTE — Progress Notes (Cosign Needed)
Pt is here for follow up, and to get Phentermine to begin wt loss b-12 inj

## 2015-05-24 ENCOUNTER — Ambulatory Visit (INDEPENDENT_AMBULATORY_CARE_PROVIDER_SITE_OTHER): Payer: No Typology Code available for payment source | Admitting: Obstetrics and Gynecology

## 2015-05-24 VITALS — BP 113/70 | HR 87 | Ht 66.6 in | Wt 273.0 lb

## 2015-05-24 DIAGNOSIS — E669 Obesity, unspecified: Secondary | ICD-10-CM | POA: Diagnosis not present

## 2015-05-24 MED ORDER — CYANOCOBALAMIN 1000 MCG/ML IJ SOLN
1000.0000 ug | Freq: Once | INTRAMUSCULAR | Status: AC
  Administered 2015-05-24: 1000 ug via INTRAMUSCULAR

## 2015-05-24 NOTE — Progress Notes (Cosign Needed)
Patient ID: Jacqueline Abbott, female   DOB: 11-30-1985, 29 y.o.   MRN: [id number] Pt here for weigh, b/p check and b-12 injection.  Weight loss of 3 lb since last visit. Pt doing well. No side effects from medication.

## 2015-06-21 ENCOUNTER — Ambulatory Visit: Payer: No Typology Code available for payment source

## 2015-06-28 ENCOUNTER — Ambulatory Visit: Payer: No Typology Code available for payment source

## 2015-07-12 ENCOUNTER — Ambulatory Visit: Payer: No Typology Code available for payment source

## 2016-01-27 ENCOUNTER — Other Ambulatory Visit: Payer: Self-pay | Admitting: *Deleted

## 2016-01-27 ENCOUNTER — Telehealth: Payer: Self-pay | Admitting: Obstetrics and Gynecology

## 2016-01-27 NOTE — Telephone Encounter (Signed)
Jacqueline Abbott gave her an antibiotic for abcesses. (amox-claz). She has another ABCESS AND WOULD LIKE THAT MEDICATION REFILLED.

## 2016-01-27 NOTE — Telephone Encounter (Signed)
Ok to refill this? 

## 2016-01-28 ENCOUNTER — Other Ambulatory Visit: Payer: Self-pay | Admitting: Obstetrics and Gynecology

## 2016-01-28 MED ORDER — AMOXICILLIN-POT CLAVULANATE 875-125 MG PO TABS: 1.0000 | ORAL_TABLET | Freq: Two times a day (BID) | ORAL | Status: DC

## 2016-01-28 NOTE — Telephone Encounter (Signed)
Done-ac 

## 2016-01-28 NOTE — Telephone Encounter (Signed)
rx sent in 

## 2016-04-08 ENCOUNTER — Telehealth: Payer: Self-pay | Admitting: Obstetrics and Gynecology

## 2016-04-08 ENCOUNTER — Other Ambulatory Visit: Payer: Self-pay | Admitting: *Deleted

## 2016-04-08 MED ORDER — NORGESTIMATE-ETH ESTRADIOL 0.25-35 MG-MCG PO TABS: 1.0000 | ORAL_TABLET | Freq: Every day | ORAL | Status: DC

## 2016-04-08 NOTE — Telephone Encounter (Signed)
Patient [facility]ill on [facility] B/[facility] [address]

## 2016-04-08 NOTE — Telephone Encounter (Signed)
Done-ac 

## 2016-04-14 ENCOUNTER — Encounter: Payer: No Typology Code available for payment source | Admitting: Obstetrics and Gynecology

## 2016-04-21 ENCOUNTER — Other Ambulatory Visit: Payer: Self-pay

## 2016-04-21 ENCOUNTER — Encounter: Payer: Self-pay | Admitting: Obstetrics and Gynecology

## 2016-04-21 ENCOUNTER — Ambulatory Visit (INDEPENDENT_AMBULATORY_CARE_PROVIDER_SITE_OTHER): Payer: 59 | Admitting: Obstetrics and Gynecology

## 2016-04-21 VITALS — BP 141/82 | HR 106 | Ht 66.0 in | Wt 283.5 lb

## 2016-04-21 DIAGNOSIS — O99210 Obesity complicating pregnancy, unspecified trimester: Secondary | ICD-10-CM | POA: Insufficient documentation

## 2016-04-21 DIAGNOSIS — Z01419 Encounter for gynecological examination (general) (routine) without abnormal findings: Secondary | ICD-10-CM | POA: Diagnosis not present

## 2016-04-21 DIAGNOSIS — E669 Obesity, unspecified: Secondary | ICD-10-CM | POA: Diagnosis not present

## 2016-04-21 MED ORDER — NORGESTIMATE-ETH ESTRADIOL 0.25-35 MG-MCG PO TABS: 1.0000 | ORAL_TABLET | Freq: Every day | ORAL | Status: DC

## 2016-04-21 MED ORDER — CITALOPRAM HYDROBROMIDE 20 MG PO TABS: 20.0000 mg | ORAL_TABLET | Freq: Every day | ORAL | Status: DC

## 2016-04-21 MED ORDER — AMOXICILLIN-POT CLAVULANATE 875-125 MG PO TABS: 1.0000 | ORAL_TABLET | Freq: Two times a day (BID) | ORAL | Status: DC

## 2016-04-21 NOTE — Progress Notes (Signed)
  Subjective:     Jacqueline Abbott is a 30 y.o. female and is here for a comprehensive physical exam. The patient reports cramping before and after menses for one week, with 3 days of normal bleeding in placebo week. Also notes boils occur week before menses.  Social History   Social History  . Marital Status: Married    Spouse Name: N/A  . Number of Children: N/A  . Years of Education: N/A   Occupational History  . Not on file.   Social History Main Topics  . Smoking status: Current Every Day Smoker    Types: Cigarettes  . Smokeless tobacco: Never Used  . Alcohol Use: No  . Drug Use: No  . Sexual Activity: Yes    Birth Control/ Protection: Pill   Other Topics Concern  . Not on file   Social History Narrative   Health Maintenance  Topic Date Due  . HIV Screening  12/30/2001  . TETANUS/TDAP  12/30/2005  . INFLUENZA VACCINE  05/05/2016  . PAP SMEAR  04/11/2018    The following portions of the patient's history were reviewed and updated as appropriate: allergies, current medications, past family history, past medical history, past social history, past surgical history and problem list.  Review of Systems Pertinent items noted in HPI and remainder of comprehensive ROS otherwise negative.   Objective:    General appearance: alert, cooperative, appears stated age and morbidly obese Neck: no adenopathy, no carotid bruit, no JVD, supple, symmetrical, trachea midline and thyroid not enlarged, symmetric, no tenderness/mass/nodules Lungs: clear to auscultation bilaterally Breasts: normal appearance, no masses or tenderness Heart: regular rate and rhythm, S1, S2 normal, no murmur, click, rub or gallop Abdomen: soft, non-tender; bowel sounds normal; no masses,  no organomegaly Pelvic: cervix normal in appearance, external genitalia normal, no adnexal masses or tenderness, no cervical motion tenderness, rectovaginal septum normal, uterus normal size, shape, and consistency and  vagina normal without discharge    Assessment:    Healthy female exam. Previous ASCUS; obesity, dysmenorrhea      Plan:  Refilled meds, and will try continuous cycling on current OCPs.   See After Visit Summary for Counseling Recommendations

## 2016-04-21 NOTE — Patient Instructions (Signed)
  Place annual gynecologic exam patient instructions here.  Thank you for enrolling in MyChart. Please follow the instructions below to securely access your online medical record. MyChart allows you to send messages to your doctor, view your test results, manage appointments, and more.   How Do I Sign Up? 1. In your Internet browser, go to [employer] and enter [email or web address]. 2. Click on the Sign Up Now link in the Sign In box. You will see the New Member Sign Up page. 3. Enter your MyChart Access Code exactly as it appears below. You will not need to use this code after you've completed the sign-up process. If you do not sign up before the expiration date, you must request a new code.  MyChart Access Code: [email or web address] Expires: 06/07/2016 10:19 AM  4. Enter your Social Security Number ([id number]) and Date of Birth (mm/dd/yyyy) as indicated and click Submit. You will be taken to the next sign-up page. 5. Create a MyChart ID. This will be your MyChart login ID and cannot be changed, so think of one that is secure and easy to remember. 6. Create a MyChart password. You can change your password at any time. 7. Enter your Password Reset Question and Answer. This can be used at a later time if you forget your password.  8. Enter your e-mail address. You will receive e-mail notification when new information is available in MyChart. 9. Click Sign Up. You can now view your medical record.   Additional Information Remember, MyChart is NOT to be used for urgent needs. For medical emergencies, dial 911.

## 2016-04-23 LAB — CYTOLOGY - PAP

## 2016-09-16 ENCOUNTER — Encounter: Payer: Self-pay | Admitting: Obstetrics and Gynecology

## 2017-03-31 ENCOUNTER — Ambulatory Visit (INDEPENDENT_AMBULATORY_CARE_PROVIDER_SITE_OTHER): Payer: 59 | Admitting: Obstetrics and Gynecology

## 2017-03-31 ENCOUNTER — Encounter: Payer: Self-pay | Admitting: Obstetrics and Gynecology

## 2017-03-31 VITALS — BP 111/77 | HR 78 | Ht 66.0 in | Wt 277.8 lb

## 2017-03-31 DIAGNOSIS — R102 Pelvic and perineal pain: Secondary | ICD-10-CM

## 2017-03-31 MED ORDER — IBUPROFEN 800 MG PO TABS: 800.0000 mg | ORAL_TABLET | Freq: Three times a day (TID) | ORAL | 1 refills | Status: DC | PRN

## 2017-03-31 NOTE — Progress Notes (Signed)
Subjective:     Patient ID: Jacqueline Abbott, female   DOB: 04/14/1986, 31 y.o.   MRN: [id number]  HPI LLQ abdominal pain x 2 months, constant and radiates around to back.  Hasn't had this type of pain in past. Worse around menses. Taking OCPs regularly.  Occasional pain with pushing down to void. Does not increased urinary frequency. And increase in vaginal discharge. Denies any bowel changes, or blood in urine. Infrequent sexual activity but denies pain or bleeding associated with it. Takes motrin, and it helps. Worse with movement, better when sitting or laying down.  Does reports history of ovarian cyst. Current pain worse than with cyst in past.   Review of Systems Negative except stated in HPI    Objective:   Physical Exam A&Ox4 Well groomed obese female in no distress Blood pressure 111/77, pulse 78, height 5\' 6"  (1.676 m), weight 277 lb 12.8 oz (126 kg), last menstrual period 03/11/2017.  Body mass index is 44.84 kg/m.  Abdomen soft, obese and not tender CVA negative Pelvic exam: normal external genitalia, vulva, vagina, cervix, uterus and adnexa.    Assessment:     LLQ pain of unknown etiology    Plan:     Will return at next opening for pelvic ultrasound. Encouraged motrin use as needed until then. Will call and discuss ultrasound findings. Reassured of normal findings on exam today.  Melody BarneveldShambley, CNM

## 2017-04-09 ENCOUNTER — Ambulatory Visit (INDEPENDENT_AMBULATORY_CARE_PROVIDER_SITE_OTHER): Payer: 59

## 2017-04-09 ENCOUNTER — Encounter: Payer: 59 | Admitting: Obstetrics and Gynecology

## 2017-04-09 DIAGNOSIS — R102 Pelvic and perineal pain: Secondary | ICD-10-CM

## 2017-04-19 ENCOUNTER — Encounter: Payer: Self-pay | Admitting: Obstetrics and Gynecology

## 2017-04-23 ENCOUNTER — Encounter: Payer: 59 | Admitting: Obstetrics and Gynecology

## 2017-06-17 ENCOUNTER — Encounter: Payer: 59 | Admitting: Obstetrics and Gynecology

## 2017-06-22 ENCOUNTER — Other Ambulatory Visit: Payer: Self-pay | Admitting: Obstetrics and Gynecology

## 2017-07-03 ENCOUNTER — Other Ambulatory Visit: Payer: Self-pay | Admitting: Obstetrics and Gynecology

## 2017-09-08 ENCOUNTER — Encounter: Payer: Self-pay | Admitting: Obstetrics and Gynecology

## 2017-09-08 ENCOUNTER — Ambulatory Visit (INDEPENDENT_AMBULATORY_CARE_PROVIDER_SITE_OTHER): Payer: 59 | Admitting: Obstetrics and Gynecology

## 2017-09-08 VITALS — BP 111/70 | HR 83 | Ht 66.0 in | Wt 273.8 lb

## 2017-09-08 DIAGNOSIS — Z01419 Encounter for gynecological examination (general) (routine) without abnormal findings: Secondary | ICD-10-CM | POA: Diagnosis not present

## 2017-09-08 MED ORDER — ALPRAZOLAM 0.5 MG PO TABS: 0.5000 mg | ORAL_TABLET | Freq: Three times a day (TID) | ORAL | 2 refills | Status: DC | PRN

## 2017-09-08 NOTE — Progress Notes (Signed)
Subjective:   Jacqueline Abbott is a 31 y.o. G1P0 Caucasian female here for a routine well-woman exam.  Patient's last menstrual period was 08/27/2017.    Current complaints: menses are better with cycling through and no pelvic pain.  Does report anxiety and panic feelings when around a lot of people and then spontaneously at home, a couple times a day. Last 30-60 minutes. Starts with heart feeling like it flips and then gets better.  PCP: me       Does desire labs  Social History: Sexual: heterosexual Marital Status: married Living situation: with family Occupation: unknown occupation Tobacco/alcohol: no tobacco use Illicit drugs: no history of illicit drug use  The following portions of the patient's history were reviewed and updated as appropriate: allergies, current medications, past family history, past medical history, past social history, past surgical history and problem list.  Past Medical History Past Medical History:  Diagnosis Date  . Anxiety   . Hidradenitis   . Left ovarian cyst   . Obesity     Past Surgical History Past Surgical History:  Procedure Laterality Date  . CHOLECYSTECTOMY, LAPAROSCOPIC  2011  . DILATION AND CURETTAGE OF UTERUS  09/2013  . HYSTEROSCOPY  09/2013    Gynecologic History G1P0  Patient's last menstrual period was 08/27/2017. Contraception: OCP (estrogen/progesterone) Last Pap: 2017. Results were: normal  Obstetric History OB History  Gravida Para Term Preterm AB Living  1         1  SAB TAB Ectopic Multiple Live Births          1    # Outcome Date GA Lbr Len/2nd Weight Sex Delivery Anes PTL Lv  1 Gravida 2016    F Vag-Spont   LIV      Current Medications Current Outpatient Medications on File Prior to Visit  Medication Sig Dispense Refill  . citalopram (CELEXA) 20 MG tablet TAKE 1 TABLET BY MOUTH EVERY DAY 90 tablet 3  . SPRINTEC 28 0.25-35 MG-MCG tablet TAKE 1 TABLET BY MOUTH DAILY. CONTINUOUS CYCLING, OMITTING PLACEBO  PILLS 112 tablet 4  . ibuprofen (ADVIL,MOTRIN) 800 MG tablet Take 1 tablet (800 mg total) by mouth every 8 (eight) hours as needed. (Patient not taking: Reported on 09/08/2017) 60 tablet 1   No current facility-administered medications on file prior to visit.     Review of Systems Patient denies any headaches, blurred vision, shortness of breath, chest pain, abdominal pain, problems with bowel movements, urination, or intercourse.  Objective:  BP 111/70   Pulse 83   Ht 5\' 6"  (1.676 m)   Wt 273 lb 12.8 oz (124.2 kg)   LMP 08/27/2017   BMI 44.19 kg/m  Physical Exam  General:  Well developed, well nourished, no acute distress. She is alert and oriented x3. Skin:  Warm and dry Neck:  Midline trachea, no thyromegaly or nodules Cardiovascular: Regular rate and rhythm, no murmur heard Lungs:  Effort normal, all lung fields clear to auscultation bilaterally Breasts:  No dominant palpable mass, retraction, or nipple discharge Abdomen:  Soft, non tender, no hepatosplenomegaly or masses Pelvic:  External genitalia is normal in appearance.  The vagina is normal in appearance. The cervix is bulbous, no CMT.  Thin prep pap is not done . Uterus is felt to be normal size, shape, and contour.  No adnexal masses or tenderness noted. Extremities:  No swelling or varicosities noted Psych:  She has a normal mood and affect  Assessment:   Healthy well-woman exam Depression  with anxiety.   Plan:  Labs obtained- will follow up accordingly Recommend stress relief and xanax use prn.increase exercise and work on weight loss. F/U 1 year for AE, or sooner if needed   Jacqueline Abbott Suzan NailerN Shimon Trowbridge, CNM

## 2017-09-08 NOTE — Patient Instructions (Signed)
Preventive Care 18-39 Years, Female Preventive care refers to lifestyle choices and visits with your health care provider that can promote health and wellness. What does preventive care include?  A yearly physical exam. This is also called an annual well check.  Dental exams once or twice a year.  Routine eye exams. Ask your health care provider how often you should have your eyes checked.  Personal lifestyle choices, including: ? Daily care of your teeth and gums. ? Regular physical activity. ? Eating a healthy diet. ? Avoiding tobacco and drug use. ? Limiting alcohol use. ? Practicing safe sex. ? Taking vitamin and mineral supplements as recommended by your health care provider. What happens during an annual well check? The services and screenings done by your health care provider during your annual well check will depend on your age, overall health, lifestyle risk factors, and family history of disease. Counseling Your health care provider may ask you questions about your:  Alcohol use.  Tobacco use.  Drug use.  Emotional well-being.  Home and relationship well-being.  Sexual activity.  Eating habits.  Work and work Statistician.  Method of birth control.  Menstrual cycle.  Pregnancy history.  Screening You may have the following tests or measurements:  Height, weight, and BMI.  Diabetes screening. This is done by checking your blood sugar (glucose) after you have not eaten for a while (fasting).  Blood pressure.  Lipid and cholesterol levels. These may be checked every 5 years starting at age 38.  Skin check.  Hepatitis C blood test.  Hepatitis B blood test.  Sexually transmitted disease (STD) testing.  BRCA-related cancer screening. This may be done if you have a family history of breast, ovarian, tubal, or peritoneal cancers.  Pelvic exam and Pap test. This may be done every 3 years starting at age 38. Starting at age 30, this may be done  every 5 years if you have a Pap test in combination with an HPV test.  Discuss your test results, treatment options, and if necessary, the need for more tests with your health care provider. Vaccines Your health care provider may recommend certain vaccines, such as:  Influenza vaccine. This is recommended every year.  Tetanus, diphtheria, and acellular pertussis (Tdap, Td) vaccine. You may need a Td booster every 10 years.  Varicella vaccine. You may need this if you have not been vaccinated.  HPV vaccine. If you are 39 or younger, you may need three doses over 6 months.  Measles, mumps, and rubella (MMR) vaccine. You may need at least one dose of MMR. You may also need a second dose.  Pneumococcal 13-valent conjugate (PCV13) vaccine. You may need this if you have certain conditions and were not previously vaccinated.  Pneumococcal polysaccharide (PPSV23) vaccine. You may need one or two doses if you smoke cigarettes or if you have certain conditions.  Meningococcal vaccine. One dose is recommended if you are age 68-21 years and a first-year college student living in a residence hall, or if you have one of several medical conditions. You may also need additional booster doses.  Hepatitis A vaccine. You may need this if you have certain conditions or if you travel or work in places where you may be exposed to hepatitis A.  Hepatitis B vaccine. You may need this if you have certain conditions or if you travel or work in places where you may be exposed to hepatitis B.  Haemophilus influenzae type b (Hib) vaccine. You may need this  if you have certain risk factors.  Talk to your health care provider about which screenings and vaccines you need and how often you need them. This information is not intended to replace advice given to you by your health care provider. Make sure you discuss any questions you have with your health care provider. Document Released: 11/17/2001 Document Revised:  06/10/2016 Document Reviewed: 07/23/2015 Elsevier Interactive Patient Education  2017 Elsevier Inc.  

## 2017-09-09 LAB — COMPREHENSIVE METABOLIC PANEL
A/G RATIO: 1.2 (ref 1.2–2.2)
ALK PHOS: 133 IU/L — AB (ref 39–117)
ALT: 46 IU/L — ABNORMAL HIGH (ref 0–32)
AST: 31 IU/L (ref 0–40)
Albumin: 3.9 g/dL (ref 3.5–5.5)
BILIRUBIN TOTAL: 0.3 mg/dL (ref 0.0–1.2)
BUN/Creatinine Ratio: 11 (ref 9–23)
BUN: 8 mg/dL (ref 6–20)
CALCIUM: 9.2 mg/dL (ref 8.7–10.2)
CHLORIDE: 106 mmol/L (ref 96–106)
CO2: 24 mmol/L (ref 20–29)
Creatinine, Ser: 0.76 mg/dL (ref 0.57–1.00)
GFR calc Af Amer: 122 mL/min/{1.73_m2} (ref >59)
GFR, EST NON AFRICAN AMERICAN: 106 mL/min/{1.73_m2} (ref >59)
GLOBULIN, TOTAL: 3.2 g/dL (ref 1.5–4.5)
Glucose: 94 mg/dL (ref 65–99)
POTASSIUM: 4.1 mmol/L (ref 3.5–5.2)
SODIUM: 140 mmol/L (ref 134–144)
Total Protein: 7.1 g/dL (ref 6.0–8.5)

## 2017-09-09 LAB — LIPID PANEL
CHOLESTEROL TOTAL: 204 mg/dL — AB (ref 100–199)
Chol/HDL Ratio: 3.3 ratio (ref 0.0–4.4)
HDL: 62 mg/dL (ref >39)
LDL CALC: 97 mg/dL (ref 0–99)
Triglycerides: 223 mg/dL — ABNORMAL HIGH (ref 0–149)
VLDL CHOLESTEROL CAL: 45 mg/dL — AB (ref 5–40)

## 2017-09-09 LAB — TSH: TSH: 0.86 u[IU]/mL (ref 0.450–4.500)

## 2017-09-09 LAB — HEMOGLOBIN A1C
ESTIMATED AVERAGE GLUCOSE: 108 mg/dL
Hgb A1c MFr Bld: 5.4 % (ref 4.8–5.6)

## 2017-12-13 ENCOUNTER — Other Ambulatory Visit: Payer: Self-pay | Admitting: Obstetrics and Gynecology

## 2018-07-10 ENCOUNTER — Other Ambulatory Visit: Payer: Self-pay | Admitting: Obstetrics and Gynecology

## 2018-09-14 ENCOUNTER — Encounter: Payer: 59 | Admitting: Obstetrics and Gynecology

## 2018-09-19 ENCOUNTER — Encounter: Payer: Self-pay | Admitting: Obstetrics and Gynecology

## 2018-09-19 ENCOUNTER — Ambulatory Visit: Payer: 59 | Admitting: Obstetrics and Gynecology

## 2018-09-19 VITALS — BP 114/71 | HR 82 | Ht 66.0 in | Wt 285.2 lb

## 2018-09-19 DIAGNOSIS — R102 Pelvic and perineal pain: Secondary | ICD-10-CM

## 2018-09-19 NOTE — Progress Notes (Signed)
  Subjective:     Patient ID: Jacqueline PlumAmanda A Wandersee, female   DOB: 1986/03/11, 32 y.o.   MRN: [id number]  HPI Lower bilateral pain for the last week. Has noted increased sharpe pains that shoot down legs with menses for about 6 months, takes pills continuously. Occasionally has BTB.  Denies pain with sex or changes in BM or urination.   Review of Systems  Genitourinary: Positive for pelvic pain.  All other systems reviewed and are negative.      Objective:   Physical Exam A&Ox4 Well groomed female in no distress Blood pressure 114/71, pulse 82, height 5\' 6"  (1.676 m), weight 285 lb 3.2 oz (129.4 kg).  Body mass index is 46.03 kg/m.  Pelvic exam: normal external genitalia, vulva, vagina, cervix, uterus and adnexa. UA negative    Assessment:     Pelvic pain    Plan:     Discussed common causes of pelvic pain, including UTI, ovarian cyst, bowel changes and vaginal infections. Will consider pelvic ultrasound in leu of h/o ovarian cysts. Ok to take Motrin 600mg  q6h prn.     Melody Shambley,CNM

## 2018-09-20 ENCOUNTER — Other Ambulatory Visit: Payer: Self-pay | Admitting: Obstetrics and Gynecology

## 2018-09-21 ENCOUNTER — Ambulatory Visit (INDEPENDENT_AMBULATORY_CARE_PROVIDER_SITE_OTHER): Payer: 59

## 2018-09-21 DIAGNOSIS — R102 Pelvic and perineal pain: Secondary | ICD-10-CM | POA: Diagnosis not present

## 2018-09-27 ENCOUNTER — Encounter: Payer: 59 | Admitting: Obstetrics and Gynecology

## 2018-10-11 ENCOUNTER — Other Ambulatory Visit: Payer: Self-pay | Admitting: Obstetrics and Gynecology

## 2018-10-26 ENCOUNTER — Other Ambulatory Visit (HOSPITAL_COMMUNITY)
Admission: RE | Admit: 2018-10-26 | Discharge: 2018-10-26 | Disposition: A | Discharge status: Home / Self Care | Payer: 59 | Source: Ambulatory Visit | Attending: Obstetrics and Gynecology | Admitting: Obstetrics and Gynecology

## 2018-10-26 ENCOUNTER — Encounter: Payer: Self-pay | Admitting: Obstetrics and Gynecology

## 2018-10-26 ENCOUNTER — Ambulatory Visit (INDEPENDENT_AMBULATORY_CARE_PROVIDER_SITE_OTHER): Payer: 59 | Admitting: Obstetrics and Gynecology

## 2018-10-26 VITALS — BP 123/72 | HR 80 | Ht 66.0 in | Wt 287.4 lb

## 2018-10-26 DIAGNOSIS — Z01419 Encounter for gynecological examination (general) (routine) without abnormal findings: Secondary | ICD-10-CM

## 2018-10-26 DIAGNOSIS — Z6841 Body Mass Index (BMI) 40.0 and over, adult: Secondary | ICD-10-CM

## 2018-10-26 DIAGNOSIS — E7800 Pure hypercholesterolemia, unspecified: Secondary | ICD-10-CM

## 2018-10-26 MED ORDER — CITALOPRAM HYDROBROMIDE 20 MG PO TABS: 20.0000 mg | ORAL_TABLET | Freq: Every day | ORAL | 3 refills | Status: DC

## 2018-10-26 MED ORDER — NORGESTIMATE-ETH ESTRADIOL 0.25-35 MG-MCG PO TABS: 1.0000 | ORAL_TABLET | Freq: Every day | ORAL | 2 refills | Status: DC

## 2018-10-26 NOTE — Patient Instructions (Signed)
 Preventive Care 18-39 Years, Female Preventive care refers to lifestyle choices and visits with your health care provider that can promote health and wellness. What does preventive care include?   A yearly physical exam. This is also called an annual well check.  Dental exams once or twice a year.  Routine eye exams. Ask your health care provider how often you should have your eyes checked.  Personal lifestyle choices, including: ? Daily care of your teeth and gums. ? Regular physical activity. ? Eating a healthy diet. ? Avoiding tobacco and drug use. ? Limiting alcohol use. ? Practicing safe sex. ? Taking vitamin and mineral supplements as recommended by your health care provider. What happens during an annual well check? The services and screenings done by your health care provider during your annual well check will depend on your age, overall health, lifestyle risk factors, and family history of disease. Counseling Your health care provider may ask you questions about your:  Alcohol use.  Tobacco use.  Drug use.  Emotional well-being.  Home and relationship well-being.  Sexual activity.  Eating habits.  Work and work environment.  Method of birth control.  Menstrual cycle.  Pregnancy history. Screening You may have the following tests or measurements:  Height, weight, and BMI.  Diabetes screening. This is done by checking your blood sugar (glucose) after you have not eaten for a while (fasting).  Blood pressure.  Lipid and cholesterol levels. These may be checked every 5 years starting at age 20.  Skin check.  Hepatitis C blood test.  Hepatitis B blood test.  Sexually transmitted disease (STD) testing.  BRCA-related cancer screening. This may be done if you have a family history of breast, ovarian, tubal, or peritoneal cancers.  Pelvic exam and Pap test. This may be done every 3 years starting at age 21. Starting at age 30, this may be done  every 5 years if you have a Pap test in combination with an HPV test. Discuss your test results, treatment options, and if necessary, the need for more tests with your health care provider. Vaccines Your health care provider may recommend certain vaccines, such as:  Influenza vaccine. This is recommended every year.  Tetanus, diphtheria, and acellular pertussis (Tdap, Td) vaccine. You may need a Td booster every 10 years.  Varicella vaccine. You may need this if you have not been vaccinated.  HPV vaccine. If you are 26 or younger, you may need three doses over 6 months.  Measles, mumps, and rubella (MMR) vaccine. You may need at least one dose of MMR. You may also need a second dose.  Pneumococcal 13-valent conjugate (PCV13) vaccine. You may need this if you have certain conditions and were not previously vaccinated.  Pneumococcal polysaccharide (PPSV23) vaccine. You may need one or two doses if you smoke cigarettes or if you have certain conditions.  Meningococcal vaccine. One dose is recommended if you are age 19-21 years and a first-year college student living in a residence hall, or if you have one of several medical conditions. You may also need additional booster doses.  Hepatitis A vaccine. You may need this if you have certain conditions or if you travel or work in places where you may be exposed to hepatitis A.  Hepatitis B vaccine. You may need this if you have certain conditions or if you travel or work in places where you may be exposed to hepatitis B.  Haemophilus influenzae type b (Hib) vaccine. You may need this if   you have certain risk factors. Talk to your health care provider about which screenings and vaccines you need and how often you need them. This information is not intended to replace advice given to you by your health care provider. Make sure you discuss any questions you have with your health care provider. Document Released: 11/17/2001 Document Revised:  05/04/2017 Document Reviewed: 07/23/2015 Elsevier Interactive Patient Education  2019 Elsevier Inc.  

## 2018-10-26 NOTE — Addendum Note (Signed)
Addended by: Rosine Beat L on: 10/26/2018 03:30 PM   Modules accepted: Orders

## 2018-10-26 NOTE — Progress Notes (Signed)
Subjective:   Jacqueline Abbott is a 33 y.o. G1P0 Caucasian female here for a routine well-woman exam.  No LMP recorded. (Menstrual status: Oral contraceptives).    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 Tobacco/alcohol: no tobacco use Illicit drugs: no history of illicit drug use  The following portions of the patient's history were reviewed and updated as appropriate: allergies, current medications, past family history, past medical history, past social history, past surgical history and problem list.  Past Medical History Past Medical History:  Diagnosis Date  . Anxiety   . Hidradenitis   . Left ovarian cyst   . Obesity     Past Surgical History Past Surgical History:  Procedure Laterality Date  . CHOLECYSTECTOMY, LAPAROSCOPIC  2011  . DILATION AND CURETTAGE OF UTERUS  09/2013  . HYSTEROSCOPY  09/2013    Gynecologic History G1P0  No LMP recorded. (Menstrual status: Oral contraceptives). Contraception: OCP (estrogen/progesterone) Last Pap: 2017. Results were: normal   Obstetric History OB History  Gravida Para Term Preterm AB Living  1         1  SAB TAB Ectopic Multiple Live Births          1    # Outcome Date GA Lbr Len/2nd Weight Sex Delivery Anes PTL Lv  1 Gravida 2016    F Vag-Spont   LIV    Current Medications Current Outpatient Medications on File Prior to Visit  Medication Sig Dispense Refill  . ALPRAZolam (XANAX) 0.5 MG tablet TAKE 1 TABLET BY MOUTH 3 TIMES DAILY AS NEEDED FOR ANXIETY 40 tablet 1  . citalopram (CELEXA) 20 MG tablet TAKE 1 TABLET BY MOUTH EVERY DAY 90 tablet 3  . SPRINTEC 28 0.25-35 MG-MCG tablet TAKE 1 TABLET BY MOUTH DAILY. CONTINUOUS CYCLING, OMITTING PLACEBO PILLS 112 tablet 2  . ibuprofen (ADVIL,MOTRIN) 800 MG tablet TAKE 1 TABLET BY MOUTH EVERY 8 HOURS AS  NEEDED (Patient not taking: Reported on 09/19/2018) 60 tablet 1   No current facility-administered medications on file prior to visit.     Review of Systems Patient denies any headaches, blurred vision, shortness of breath, chest pain, abdominal pain, problems with bowel movements, urination, or intercourse.  Objective:  BP 123/72   Pulse 80   Ht 5\' 6"  (1.676 m)   Wt 287 lb 6.4 oz (130.4 kg)   BMI 46.39 kg/m  Physical Exam  General:  Well developed, well nourished, no acute distress. She is alert and oriented x3. Skin:  Warm and dry Neck:  Midline trachea, no thyromegaly or nodules Cardiovascular: Regular rate and rhythm, no murmur heard Lungs:  Effort normal, all lung fields clear to auscultation bilaterally Breasts:  No dominant palpable mass, retraction, or nipple discharge Abdomen:  Soft, non tender, no hepatosplenomegaly or masses Pelvic:  External genitalia is normal in appearance.  The vagina is normal in appearance. The cervix is bulbous, no CMT.  Thin prep pap is done with HR HPV cotesting. Uterus is felt to be normal size, shape, and contour.  No adnexal masses or tenderness noted. Extremities:  No swelling or varicosities noted Psych:  She has a normal mood and affect  Assessment:   Healthy well-woman exam Depression with anxiety Obesity/BMI 46  Plan:  Plans to stop OCPs next month and try for pregnancy, took 6 years to achieve first pregnancy.  Encouraged weight loss and staying active. Declined flu shot.  F/U 1 year for AE, or sooner if needed   Jacqueline Abbott Suzan Nailer, CNM

## 2018-10-27 LAB — HEMOGLOBIN A1C
ESTIMATED AVERAGE GLUCOSE: 108 mg/dL
Hgb A1c MFr Bld: 5.4 % (ref 4.8–5.6)

## 2018-10-27 LAB — COMPREHENSIVE METABOLIC PANEL
ALT: 15 IU/L (ref 0–32)
AST: 12 IU/L (ref 0–40)
Albumin/Globulin Ratio: 1.5 (ref 1.2–2.2)
Albumin: 4 g/dL (ref 3.8–4.8)
Alkaline Phosphatase: 85 IU/L (ref 39–117)
BUN/Creatinine Ratio: 13 (ref 9–23)
BUN: 10 mg/dL (ref 6–20)
Bilirubin Total: 0.3 mg/dL (ref 0.0–1.2)
CALCIUM: 9.2 mg/dL (ref 8.7–10.2)
CO2: 21 mmol/L (ref 20–29)
Chloride: 104 mmol/L (ref 96–106)
Creatinine, Ser: 0.77 mg/dL (ref 0.57–1.00)
GFR calc Af Amer: 119 mL/min/{1.73_m2} (ref >59)
GFR calc non Af Amer: 103 mL/min/{1.73_m2} (ref >59)
Globulin, Total: 2.7 g/dL (ref 1.5–4.5)
Glucose: 86 mg/dL (ref 65–99)
Potassium: 4.4 mmol/L (ref 3.5–5.2)
SODIUM: 139 mmol/L (ref 134–144)
Total Protein: 6.7 g/dL (ref 6.0–8.5)

## 2018-10-27 LAB — LIPID PANEL
Chol/HDL Ratio: 2.5 ratio (ref 0.0–4.4)
Cholesterol, Total: 185 mg/dL (ref 100–199)
HDL: 75 mg/dL (ref >39)
LDL Calculated: 85 mg/dL (ref 0–99)
Triglycerides: 125 mg/dL (ref 0–149)
VLDL Cholesterol Cal: 25 mg/dL (ref 5–40)

## 2018-10-27 LAB — TSH: TSH: 0.905 u[IU]/mL (ref 0.450–4.500)

## 2018-11-01 LAB — CYTOLOGY - PAP
Diagnosis: NEGATIVE
HPV: NOT DETECTED

## 2019-04-19 ENCOUNTER — Telehealth: Payer: Self-pay | Admitting: Obstetrics and Gynecology

## 2019-04-19 NOTE — Telephone Encounter (Signed)
Patient called stating she has lost 20+ pounds in 2 weeks due to be being so nauseated. She would like a call back ASAP. I informed patient you and Melody have already left for the day and will get the message in the morning.

## 2019-04-20 ENCOUNTER — Other Ambulatory Visit: Payer: Self-pay | Admitting: *Deleted

## 2019-04-20 MED ORDER — ONDANSETRON 4 MG PO TBDP: 4.0000 mg | ORAL_TABLET | Freq: Three times a day (TID) | ORAL | 4 refills | Status: DC | PRN

## 2019-04-20 MED ORDER — PROMETHAZINE HCL 12.5 MG RE SUPP: 12.5000 mg | Freq: Four times a day (QID) | RECTAL | 0 refills | Status: DC | PRN

## 2019-04-20 NOTE — Telephone Encounter (Signed)
Called pt in zofran and phengran

## 2019-04-21 ENCOUNTER — Encounter: Payer: Self-pay | Admitting: Obstetrics and Gynecology

## 2019-04-21 ENCOUNTER — Other Ambulatory Visit: Payer: Self-pay

## 2019-04-21 ENCOUNTER — Ambulatory Visit: Payer: 59 | Admitting: Obstetrics and Gynecology

## 2019-04-21 VITALS — BP 107/67 | HR 98 | Ht 66.0 in | Wt 277.6 lb

## 2019-04-21 DIAGNOSIS — N926 Irregular menstruation, unspecified: Secondary | ICD-10-CM

## 2019-04-21 DIAGNOSIS — Z6841 Body Mass Index (BMI) 40.0 and over, adult: Secondary | ICD-10-CM | POA: Diagnosis not present

## 2019-04-21 DIAGNOSIS — O219 Vomiting of pregnancy, unspecified: Secondary | ICD-10-CM

## 2019-04-21 DIAGNOSIS — O26899 Other specified pregnancy related conditions, unspecified trimester: Secondary | ICD-10-CM

## 2019-04-21 DIAGNOSIS — R12 Heartburn: Secondary | ICD-10-CM

## 2019-04-21 LAB — POCT URINE PREGNANCY: Preg Test, Ur: POSITIVE — AB

## 2019-04-21 MED ORDER — PYRIDOXINE HCL 25 MG PO TABS: 25.0000 mg | ORAL_TABLET | Freq: Four times a day (QID) | ORAL | 3 refills | Status: DC | PRN

## 2019-04-21 MED ORDER — SERTRALINE HCL 50 MG PO TABS: 50.0000 mg | ORAL_TABLET | Freq: Every day | ORAL | 6 refills | Status: DC

## 2019-04-21 MED ORDER — METOCLOPRAMIDE HCL 10 MG PO TABS: 10.0000 mg | ORAL_TABLET | Freq: Four times a day (QID) | ORAL | 2 refills | Status: DC | PRN

## 2019-04-21 NOTE — Patient Instructions (Addendum)
First Trimester of Pregnancy The first trimester of pregnancy is from week 1 until the end of week 13 (months 1 through 3). A week after a sperm fertilizes an egg, the egg will implant on the wall of the uterus. This embryo will begin to develop into a baby. Genes from you and your partner will form the baby. The female genes will determine whether the baby will be a boy or a girl. At 6-8 weeks, the eyes and face will be formed, and the heartbeat can be seen on ultrasound. At the end of 12 weeks, all the baby's organs will be formed. Now that you are pregnant, you will want to do everything you can to have a healthy baby. Two of the most important things are to get good prenatal care and to follow your health care provider's instructions. Prenatal care is all the medical care you receive before the baby's birth. This care will help prevent, find, and treat any problems during the pregnancy and childbirth. Body changes during your first trimester Your body goes through many changes during pregnancy. The changes vary from woman to woman.  You may gain or lose a couple of pounds at first.  You may feel sick to your stomach (nauseous) and you may throw up (vomit). If the vomiting is uncontrollable, call your health care provider.  You may tire easily.  You may develop headaches that can be relieved by medicines. All medicines should be approved by your health care provider.  You may urinate more often. Painful urination may mean you have a bladder infection.  You may develop heartburn as a result of your pregnancy.  You may develop constipation because certain hormones are causing the muscles that push stool through your intestines to slow down.  You may develop hemorrhoids or swollen veins (varicose veins).  Your breasts may begin to grow larger and become tender. Your nipples may stick out more, and the tissue that surrounds them (areola) may become darker.  Your gums may bleed and may be  sensitive to brushing and flossing.  Dark spots or blotches (chloasma, mask of pregnancy) may develop on your face. This will likely fade after the baby is born.  Your menstrual periods will stop.  You may have a loss of appetite.  You may develop cravings for certain kinds of food.  You may have changes in your emotions from day to day, such as being excited to be pregnant or being concerned that something may go wrong with the pregnancy and baby.  You may have more vivid and strange dreams.  You may have changes in your hair. These can include thickening of your hair, rapid growth, and changes in texture. Some women also have hair loss during or after pregnancy, or hair that feels dry or thin. Your hair will most likely return to normal after your baby is born. What to expect at prenatal visits During a routine prenatal visit:  You will be weighed to make sure you and the baby are growing normally.  Your blood pressure will be taken.  Your abdomen will be measured to track your baby's growth.  The fetal heartbeat will be listened to between weeks 10 and 14 of your pregnancy.  Test results from any previous visits will be discussed. Your health care provider may ask you:  How you are feeling.  If you are feeling the baby move.  If you have had any abnormal symptoms, such as leaking fluid, bleeding, severe headaches, or abdominal   cramping.  If you are using any tobacco products, including cigarettes, chewing tobacco, and electronic cigarettes.  If you have any questions. Other tests that may be performed during your first trimester include:  Blood tests to find your blood type and to check for the presence of any previous infections. The tests will also be used to check for low iron levels (anemia) and protein on red blood cells (Rh antibodies). Depending on your risk factors, or if you previously had diabetes during pregnancy, you may have tests to check for high blood sugar  that affects pregnant women (gestational diabetes).  Urine tests to check for infections, diabetes, or protein in the urine.  An ultrasound to confirm the proper growth and development of the baby.  Fetal screens for spinal cord problems (spina bifida) and Down syndrome.  HIV (human immunodeficiency virus) testing. Routine prenatal testing includes screening for HIV, unless you choose not to have this test.  You may need other tests to make sure you and the baby are doing well. Follow these instructions at home: Medicines  Follow your health care provider's instructions regarding medicine use. Specific medicines may be either safe or unsafe to take during pregnancy.  Take a prenatal vitamin that contains at least 600 micrograms (mcg) of folic acid.  If you develop constipation, try taking a stool softener if your health care provider approves. Eating and drinking   Eat a balanced diet that includes fresh fruits and vegetables, whole grains, good sources of protein such as meat, eggs, or tofu, and low-fat dairy. Your health care provider will help you determine the amount of weight gain that is right for you.  Avoid raw meat and uncooked cheese. These carry germs that can cause birth defects in the baby.  Eating four or five small meals rather than three large meals a day may help relieve nausea and vomiting. If you start to feel nauseous, eating a few soda crackers can be helpful. Drinking liquids between meals, instead of during meals, also seems to help ease nausea and vomiting.  Limit foods that are high in fat and processed sugars, such as fried and sweet foods.  To prevent constipation: ? Eat foods that are high in fiber, such as fresh fruits and vegetables, whole grains, and beans. ? Drink enough fluid to keep your urine clear or pale yellow. Activity  Exercise only as directed by your health care provider. Most women can continue their usual exercise routine during  pregnancy. Try to exercise for 30 minutes at least 5 days a week. Exercising will help you: ? Control your weight. ? Stay in shape. ? Be prepared for labor and delivery.  Experiencing pain or cramping in the lower abdomen or lower back is a good sign that you should stop exercising. Check with your health care provider before continuing with normal exercises.  Try to avoid standing for long periods of time. Move your legs often if you must stand in one place for a long time.  Avoid heavy lifting.  Wear low-heeled shoes and practice good posture.  You may continue to have sex unless your health care provider tells you not to. Relieving pain and discomfort  Wear a good support bra to relieve breast tenderness.  Take warm sitz baths to soothe any pain or discomfort caused by hemorrhoids. Use hemorrhoid cream if your health care provider approves.  Rest with your legs elevated if you have leg cramps or low back pain.  If you develop varicose veins in   your legs, wear support hose. Elevate your feet for 15 minutes, 3-4 times a day. Limit salt in your diet. Prenatal care  Schedule your prenatal visits by the twelfth week of pregnancy. They are usually scheduled monthly at first, then more often in the last 2 months before delivery.  Write down your questions. Take them to your prenatal visits.  Keep all your prenatal visits as told by your health care provider. This is important. Safety  Wear your seat belt at all times when driving.  Make a list of emergency phone numbers, including numbers for family, friends, the hospital, and police and fire departments. General instructions  Ask your health care provider for a referral to a local prenatal education class. Begin classes no later than the beginning of month 6 of your pregnancy.  Ask for help if you have counseling or nutritional needs during pregnancy. Your health care provider can offer advice or refer you to specialists for help  with various needs.  Do not use hot tubs, steam rooms, or saunas.  Do not douche or use tampons or scented sanitary pads.  Do not cross your legs for long periods of time.  Avoid cat litter boxes and soil used by cats. These carry germs that can cause birth defects in the baby and possibly loss of the fetus by miscarriage or stillbirth.  Avoid all smoking, herbs, alcohol, and medicines not prescribed by your health care provider. Chemicals in these products affect the formation and growth of the baby.  Do not use any products that contain nicotine or tobacco, such as cigarettes and e-cigarettes. If you need help quitting, ask your health care provider. You may receive counseling support and other resources to help you quit.  Schedule a dentist appointment. At home, brush your teeth with a soft toothbrush and be gentle when you floss. Contact a health care provider if:  You have dizziness.  You have mild pelvic cramps, pelvic pressure, or nagging pain in the abdominal area.  You have persistent nausea, vomiting, or diarrhea.  You have a bad smelling vaginal discharge.  You have pain when you urinate.  You notice increased swelling in your face, hands, legs, or ankles.  You are exposed to fifth disease or chickenpox.  You are exposed to [place] measles (rubella) and have never had it. Get help right away if:  You have a fever.  You are leaking fluid from your vagina.  You have spotting or bleeding from your vagina.  You have severe abdominal cramping or pain.  You have rapid weight gain or loss.  You vomit blood or material that looks like coffee grounds.  You develop a severe headache.  You have shortness of breath.  You have any kind of trauma, such as from a fall or a car accident. Summary  The first trimester of pregnancy is from week 1 until the end of week 13 (months 1 through 3).  Your body goes through many changes during pregnancy. The changes vary from  woman to woman.  You will have routine prenatal visits. During those visits, your health care provider will examine you, discuss any test results you may have, and talk with you about how you are feeling. This information is not intended to replace advice given to you by your health care provider. Make sure you discuss any questions you have with your health care provider. Document Released: 09/15/2001 Document Revised: 09/03/2017 Document Reviewed: 09/02/2016 Elsevier Patient Education  2020 [employer] Morning Sickness  Morning sickness is when a woman feels nauseous during pregnancy. This nauseous feeling may or may not come with vomiting. It often occurs in the morning, but it can be a problem at any time of day. Morning sickness is most common during the first trimester. In some cases, it may continue throughout pregnancy. Although morning sickness is unpleasant, it is usually harmless unless the woman develops severe and continual vomiting (hyperemesis gravidarum), a condition that requires more intense treatment. What are the causes? The exact cause of this condition is not known, but it seems to be related to normal hormonal changes that occur in pregnancy. What increases the risk? You are more likely to develop this condition if:  You experienced nausea or vomiting before your pregnancy.  You had morning sickness during a previous pregnancy.  You are pregnant with more than one baby, such as twins. What are the signs or symptoms? Symptoms of this condition include:  Nausea.  Vomiting. How is this diagnosed? This condition is usually diagnosed based on your signs and symptoms. How is this treated? In many cases, treatment is not needed for this condition. Making some changes to what you eat may help to control symptoms. Your health care provider may also prescribe or recommend:  Vitamin B6 supplements.  Anti-nausea medicines.  Ginger. Follow these instructions at home:  Medicines  Take over-the-counter and prescription medicines only as told by your health care provider. Do not use any prescription, over-the-counter, or herbal medicines for morning sickness without first talking with your health care provider.  Taking multivitamins before getting pregnant can prevent or decrease the severity of morning sickness in most women. Eating and drinking  Eat a piece of dry toast or crackers before getting out of bed in the morning.  Eat 5 or 6 small meals a day.  Eat dry and bland foods, such as rice or a baked potato. Foods that are high in carbohydrates are often helpful.  Avoid greasy, fatty, and spicy foods.  Have someone cook for you if the smell of any food causes nausea and vomiting.  If you feel nauseous after taking prenatal vitamins, take the vitamins at night or with a snack.  Snack on protein foods between meals if you are hungry. Nuts, yogurt, and cheese are good options.  Drink fluids throughout the day.  Try ginger ale made with real ginger, ginger tea made from fresh grated ginger, or ginger candies. General instructions  Do not use any products that contain nicotine or tobacco, such as cigarettes and e-cigarettes. If you need help quitting, ask your health care provider.  Get an air purifier to keep the air in your house free of odors.  Get plenty of fresh air.  Try to avoid odors that trigger your nausea.  Consider trying these methods to help relieve symptoms: ? Wearing an acupressure wristband. These wristbands are often worn for seasickness. ? Acupuncture. Contact a health care provider if:  Your home remedies are not working and you need medicine.  You feel dizzy or light-headed.  You are losing weight. Get help right away if:  You have persistent and uncontrolled nausea and vomiting.  You faint.  You have severe pain in your abdomen. Summary  Morning sickness is when a woman feels nauseous during pregnancy. This  nauseous feeling may or may not come with vomiting.  Morning sickness is most common during the first trimester.  It often occurs in the morning, but it can be a problem at any time  of day.  In many cases, treatment is not needed for this condition. Making some changes to what you eat may help to control symptoms. This information is not intended to replace advice given to you by your health care provider. Make sure you discuss any questions you have with your health care provider. Document Released: 11/12/2006 Document Revised: 09/03/2017 Document Reviewed: 10/24/2016 Elsevier Patient Education  2020 [employer].

## 2019-04-21 NOTE — Progress Notes (Signed)
Pt is very nauseated, she did stop taking her Celexa "cold [place]"

## 2019-04-21 NOTE — Progress Notes (Signed)
  Subjective:     Patient ID: Jacqueline Abbott, female   DOB: May 01, 1986, 33 y.o.   MRN: [id number]  HPI Here for pregnancy confirmation reporting LMP 03/08/2019, giving EDC 12/13/2019 and EGA [redacted]w[redacted]d. Is very nauseated. zofran and phenergan is not helping much. She was this way with her first pregnancy. Stopped celexa (did not taper off) two weeks ago. This is her second pregnancy. She states anxiety is markedly worse too.   Review of Systems  Constitutional: Positive for fever.  Gastrointestinal: Positive for nausea.  Psychiatric/Behavioral: The patient is nervous/anxious.   All other systems reviewed and are negative.      Objective:   Physical Exam A&Ox4 Well groomed female Blood pressure 107/67, pulse 98, height 5\' 6"  (1.676 m), weight 277 lb 9.6 oz (125.9 kg), last menstrual period 03/08/2019. Body mass index is 44.81 kg/m.  UPT+    Assessment:     Missed menses BMI 44 Nausea and vomiting in pregnancy Heartburn     Plan:    will start zoloft at 50mg . Added reglan and B12 and instructed to alternate zofran and phenergan around the clock, rest and push fluids.  Congratulated on pregnancy. Discussed routine prenatal care and common treatments for nausea. RTC in 2 weeks for viablilty scan, nurse intake and labs, then in 6 weeks for new OB PE with Me.    Quientin Jent,CNM

## 2019-05-09 ENCOUNTER — Ambulatory Visit (INDEPENDENT_AMBULATORY_CARE_PROVIDER_SITE_OTHER): Payer: 59 | Admitting: Obstetrics and Gynecology

## 2019-05-09 ENCOUNTER — Other Ambulatory Visit: Payer: Self-pay

## 2019-05-09 ENCOUNTER — Other Ambulatory Visit: Payer: Self-pay | Admitting: Obstetrics and Gynecology

## 2019-05-09 ENCOUNTER — Ambulatory Visit (INDEPENDENT_AMBULATORY_CARE_PROVIDER_SITE_OTHER): Payer: 59

## 2019-05-09 DIAGNOSIS — N8312 Corpus luteum cyst of left ovary: Secondary | ICD-10-CM | POA: Diagnosis not present

## 2019-05-09 DIAGNOSIS — Z3A09 9 weeks gestation of pregnancy: Secondary | ICD-10-CM

## 2019-05-09 DIAGNOSIS — O3481 Maternal care for other abnormalities of pelvic organs, first trimester: Secondary | ICD-10-CM | POA: Diagnosis not present

## 2019-05-09 DIAGNOSIS — N926 Irregular menstruation, unspecified: Secondary | ICD-10-CM

## 2019-05-10 ENCOUNTER — Encounter: Payer: 59 | Admitting: Obstetrics and Gynecology

## 2019-05-10 LAB — URINALYSIS, ROUTINE W REFLEX MICROSCOPIC
Bilirubin, UA: NEGATIVE
Glucose, UA: NEGATIVE
Ketones, UA: NEGATIVE
Leukocytes,UA: NEGATIVE
Nitrite, UA: NEGATIVE
RBC, UA: NEGATIVE
Specific Gravity, UA: 1.021 (ref 1.005–1.030)
Urobilinogen, Ur: 1 mg/dL (ref 0.2–1.0)
pH, UA: 8.5 — ABNORMAL HIGH (ref 5.0–7.5)

## 2019-05-10 LAB — HIV ANTIBODY (ROUTINE TESTING W REFLEX): HIV Screen 4th Generation wRfx: NONREACTIVE

## 2019-05-10 LAB — RPR: RPR Ser Ql: NONREACTIVE

## 2019-05-10 LAB — HEPATITIS B SURFACE ANTIGEN: Hepatitis B Surface Ag: NEGATIVE

## 2019-05-10 LAB — ABO AND RH: Rh Factor: POSITIVE

## 2019-05-10 LAB — TSH: TSH: 0.775 u[IU]/mL (ref 0.450–4.500)

## 2019-05-10 LAB — HEMOGLOBIN A1C
Est. average glucose Bld gHb Est-mCnc: 100 mg/dL
Hgb A1c MFr Bld: 5.1 % (ref 4.8–5.6)

## 2019-05-10 LAB — RUBELLA SCREEN: Rubella Antibodies, IGG: 7.2 index (ref >0.99)

## 2019-05-10 LAB — HGB SOLU + RFLX FRAC: Sickle Solubility Test - HGBRFX: NEGATIVE

## 2019-05-10 LAB — VARICELLA ZOSTER ANTIBODY, IGG: Varicella zoster IgG: 2708 index (ref >165)

## 2019-05-11 LAB — URINE CULTURE

## 2019-05-14 LAB — GC/CHLAMYDIA PROBE AMP
Chlamydia trachomatis, NAA: NEGATIVE
Neisseria Gonorrhoeae by PCR: NEGATIVE

## 2019-05-16 ENCOUNTER — Other Ambulatory Visit: Payer: Self-pay | Admitting: *Deleted

## 2019-05-16 MED ORDER — SERTRALINE HCL 50 MG PO TABS: 50.0000 mg | ORAL_TABLET | Freq: Every day | ORAL | 6 refills | Status: DC

## 2019-05-25 ENCOUNTER — Other Ambulatory Visit: Payer: Self-pay

## 2019-05-25 ENCOUNTER — Encounter: Payer: Self-pay | Admitting: Obstetrics and Gynecology

## 2019-05-25 ENCOUNTER — Ambulatory Visit (INDEPENDENT_AMBULATORY_CARE_PROVIDER_SITE_OTHER): Payer: 59 | Admitting: Obstetrics and Gynecology

## 2019-05-25 VITALS — BP 116/75 | HR 101 | Wt 268.9 lb

## 2019-05-25 DIAGNOSIS — O99341 Other mental disorders complicating pregnancy, first trimester: Secondary | ICD-10-CM

## 2019-05-25 DIAGNOSIS — Z3A11 11 weeks gestation of pregnancy: Secondary | ICD-10-CM

## 2019-05-25 DIAGNOSIS — Z3491 Encounter for supervision of normal pregnancy, unspecified, first trimester: Secondary | ICD-10-CM

## 2019-05-25 DIAGNOSIS — O219 Vomiting of pregnancy, unspecified: Secondary | ICD-10-CM

## 2019-05-25 DIAGNOSIS — Z6841 Body Mass Index (BMI) 40.0 and over, adult: Secondary | ICD-10-CM

## 2019-05-25 DIAGNOSIS — F419 Anxiety disorder, unspecified: Secondary | ICD-10-CM

## 2019-05-25 LAB — POCT URINALYSIS DIPSTICK OB
Bilirubin, UA: NEGATIVE
Blood, UA: NEGATIVE
Glucose, UA: NEGATIVE
Ketones, UA: 5
Leukocytes, UA: NEGATIVE
Nitrite, UA: NEGATIVE
Spec Grav, UA: 1.02 (ref 1.010–1.025)
Urobilinogen, UA: 0.2 E.U./dL
pH, UA: 6.5 (ref 5.0–8.0)

## 2019-05-25 MED ORDER — CITALOPRAM HYDROBROMIDE 20 MG PO TABS: 20.0000 mg | ORAL_TABLET | Freq: Every day | ORAL | 3 refills | Status: DC

## 2019-05-25 MED ORDER — ONDANSETRON 4 MG PO TBDP: 4.0000 mg | ORAL_TABLET | Freq: Three times a day (TID) | ORAL | 4 refills | Status: DC | PRN

## 2019-05-25 NOTE — Patient Instructions (Signed)

## 2019-05-25 NOTE — Progress Notes (Signed)
NOB PE- pt is still having issues with depression, Zoloft made her sick-more nauseated, still feels as if she needs something to help,having some headaches, panaroma done today

## 2019-05-25 NOTE — Progress Notes (Signed)
NEW OB HISTORY AND PHYSICAL  SUBJECTIVE:       Jacqueline Abbott is a 33 y.o. [id number] female, Patient's last menstrual period was 03/08/2019., Estimated Date of Delivery: 12/13/19, 3420w1d, presents today for establishment of Prenatal Care. She has no unusual complaints and complains of nausea with vomiting , worsening anxiety.      Gynecologic History Patient's last menstrual period was 03/08/2019. Normal Contraception: none Last Pap: 10/2018. Results were: normal  Obstetric History OB History  Gravida Para Term Preterm AB Living  2 1 1     1   SAB TAB Ectopic Multiple Live Births          1    # Outcome Date GA Lbr Len/2nd Weight Sex Delivery Anes PTL Lv  2 Current           1 Term 2016 851w0d  8 lb (3.629 kg) F Vag-Spont EPI N LIV    Past Medical History:  Diagnosis Date  . Anxiety   . Hidradenitis   . Left ovarian cyst   . Obesity     Past Surgical History:  Procedure Laterality Date  . CHOLECYSTECTOMY, LAPAROSCOPIC  2011  . DILATION AND CURETTAGE OF UTERUS  09/2013  . HYSTEROSCOPY  09/2013    Current Outpatient Medications on File Prior to Visit  Medication Sig Dispense Refill  . metoCLOPramide (REGLAN) 10 MG tablet Take 1 tablet (10 mg total) by mouth 4 (four) times daily as needed for nausea or vomiting. 30 tablet 2  . ondansetron (ZOFRAN ODT) 4 MG disintegrating tablet Take 1 tablet (4 mg total) by mouth every 8 (eight) hours as needed for nausea or vomiting. 20 tablet 4  . ibuprofen (ADVIL,MOTRIN) 800 MG tablet TAKE 1 TABLET BY MOUTH EVERY 8 HOURS AS NEEDED (Patient not taking: Reported on 09/19/2018) 60 tablet 1  . promethazine (PHENERGAN) 12.5 MG suppository Place 1 suppository (12.5 mg total) rectally every 6 (six) hours as needed for nausea or vomiting. (Patient not taking: Reported on 05/25/2019) 12 each 0  . pyridOXINE (VITAMIN B-6) 25 MG tablet Take 1 tablet (25 mg total) by mouth 4 (four) times daily as needed (nausea and vomiting). (Patient not taking:  Reported on 05/25/2019) 30 tablet 3  . sertraline (ZOLOFT) 50 MG tablet Take 1 tablet (50 mg total) by mouth daily. (Patient not taking: Reported on 05/25/2019) 90 tablet 6   No current facility-administered medications on file prior to visit.     Allergies  Allergen Reactions  . Shellfish Allergy Swelling    Social History   Socioeconomic History  . Marital status: Married    Spouse name: Not on file  . Number of children: Not on file  . Years of education: Not on file  . Highest education level: Not on file  Occupational History  . Not on file  Social Needs  . Financial resource strain: Not on file  . Food insecurity    Worry: Not on file    Inability: Not on file  . Transportation needs    Medical: Not on file    Non-medical: Not on file  Tobacco Use  . Smoking status: Current Every Day Smoker    Types: Cigarettes  . Smokeless tobacco: Never Used  Substance and Sexual Activity  . Alcohol use: No  . Drug use: No  . Sexual activity: Yes    Birth control/protection: Pill  Lifestyle  . Physical activity    Days per week: Not on file    Minutes per  session: Not on file  . Stress: Not on file  Relationships  . Social Herbalist on phone: Not on file    Gets together: Not on file    Attends religious service: Not on file    Active member of club or organization: Not on file    Attends meetings of clubs or organizations: Not on file    Relationship status: Not on file  . Intimate partner violence    Fear of current or ex partner: Not on file    Emotionally abused: Not on file    Physically abused: Not on file    Forced sexual activity: Not on file  Other Topics Concern  . Not on file  Social History Narrative  . Not on file    Family History  Problem Relation Age of Onset  . Hypertension Maternal Grandmother   . Heart disease Maternal Grandmother   . Arthritis Father     The following portions of the patient's history were reviewed and updated  as appropriate: allergies, current medications, past OB history, past medical history, past surgical history, past family history, past social history, and problem list. GAD 7 : Generalized Anxiety Score 05/25/2019  Nervous, Anxious, on Edge 3  Control/stop worrying 2  Worry too much - different things 2  Trouble relaxing 3  Restless 2  Easily annoyed or irritable 2  Afraid - awful might happen 2  Total GAD 7 Score 16  Anxiety Difficulty Very difficult      OBJECTIVE: Initial Physical Exam (New OB)  GENERAL APPEARANCE: alert, well appearing, in no apparent distress, oriented to person, place and time, in mild to moderate distress, crying HEAD: normocephalic, atraumatic MOUTH: mucous membranes moist, pharynx normal without lesions and dental hygiene good THYROID: no thyromegaly or masses present BREASTS: no masses noted, no significant tenderness, no palpable axillary nodes, no skin changes LUNGS: clear to auscultation, no wheezes, rales or rhonchi, symmetric air entry HEART: regular rate and rhythm, no murmurs ABDOMEN: soft, nontender, nondistended, no abnormal masses, no epigastric pain, obese, fundus not palpable and FHT present EXTREMITIES: no redness or tenderness in the calves or thighs SKIN: normal coloration and turgor, no rashes LYMPH NODES: no adenopathy palpable NEUROLOGIC: alert, oriented, normal speech, no focal findings or movement disorder noted  PELVIC EXAM not indicated  ASSESSMENT: Normal pregnancy BMI 44 Anxiety Smoker N/V  PLAN: Prenatal care to include early glucola and anesthesia consult.  Will restart celexa as couldn't tolerate zoloft. Encouraged smoking cessation. See orders

## 2019-05-26 LAB — CBC
Hematocrit: 39.8 % (ref 34.0–46.6)
Hemoglobin: 13.9 g/dL (ref 11.1–15.9)
MCH: 31.8 pg (ref 26.6–33.0)
MCHC: 34.9 g/dL (ref 31.5–35.7)
MCV: 91 fL (ref 79–97)
Platelets: 316 10*3/uL (ref 150–450)
RBC: 4.37 x10E6/uL (ref 3.77–5.28)
RDW: 12.9 % (ref 11.7–15.4)
WBC: 10.9 10*3/uL — ABNORMAL HIGH (ref 3.4–10.8)

## 2019-06-22 ENCOUNTER — Other Ambulatory Visit: Payer: Self-pay

## 2019-06-22 ENCOUNTER — Ambulatory Visit (INDEPENDENT_AMBULATORY_CARE_PROVIDER_SITE_OTHER): Payer: 59 | Admitting: Certified Nurse Midwife

## 2019-06-22 VITALS — BP 103/61 | HR 81 | Wt 269.0 lb

## 2019-06-22 DIAGNOSIS — O99210 Obesity complicating pregnancy, unspecified trimester: Secondary | ICD-10-CM

## 2019-06-22 DIAGNOSIS — F419 Anxiety disorder, unspecified: Secondary | ICD-10-CM

## 2019-06-22 DIAGNOSIS — Z3492 Encounter for supervision of normal pregnancy, unspecified, second trimester: Secondary | ICD-10-CM

## 2019-06-22 DIAGNOSIS — Z3A15 15 weeks gestation of pregnancy: Secondary | ICD-10-CM

## 2019-06-22 DIAGNOSIS — O219 Vomiting of pregnancy, unspecified: Secondary | ICD-10-CM | POA: Insufficient documentation

## 2019-06-22 LAB — POCT URINALYSIS DIPSTICK OB
Appearance: NEGATIVE
Bilirubin, UA: NEGATIVE
Blood, UA: NEGATIVE
Glucose, UA: NEGATIVE
Nitrite, UA: NEGATIVE
Spec Grav, UA: 1.01 (ref 1.010–1.025)
Urobilinogen, UA: 0.2 E.U./dL
pH, UA: 7.5 (ref 5.0–8.0)

## 2019-06-22 MED ORDER — METOCLOPRAMIDE HCL 10 MG PO TABS: 10.0000 mg | ORAL_TABLET | Freq: Four times a day (QID) | ORAL | 2 refills | Status: DC | PRN

## 2019-06-22 NOTE — Progress Notes (Signed)
ROB-No complaints.  

## 2019-06-22 NOTE — Progress Notes (Addendum)
ROB-Reports daily nausea with vomiting 2-3 times per week. Zofran helping. Request Reglan refill, see orders. Taking half tablet of Celexa daily, encouraged to increased to whole tablet (20 mg) daily. Declines flu vaccine. Ready, Set, Baby completed. Anesthesia consult scheduled for Tuesday, 10/20 at 0900. Anticipatory guidance regarding course of prenatal care. Reviewed red flag symptoms and when to call. RTC x 4-5 weeks for ANATOMY SCAN and ROB or sooner if needed.   GAD 7 : Generalized Anxiety Score 06/22/2019 05/25/2019  Nervous, Anxious, on Edge 3 3  Control/stop worrying 3 2  Worry too much - different things 3 2  Trouble relaxing 3 3  Restless 3 2  Easily annoyed or irritable 3 2  Afraid - awful might happen 3 2  Total GAD 7 Score 21 16  Anxiety Difficulty Somewhat difficult Very difficult

## 2019-06-22 NOTE — Patient Instructions (Signed)

## 2019-06-28 ENCOUNTER — Telehealth: Payer: Self-pay | Admitting: Obstetrics and Gynecology

## 2019-06-28 NOTE — Telephone Encounter (Signed)
Patient called stating she has a greenish discharge and is concerned since she is pregnant. Please Advise.

## 2019-07-03 ENCOUNTER — Telehealth: Payer: Self-pay

## 2019-07-03 ENCOUNTER — Other Ambulatory Visit: Payer: Self-pay | Admitting: Certified Nurse Midwife

## 2019-07-03 DIAGNOSIS — O99212 Obesity complicating pregnancy, second trimester: Secondary | ICD-10-CM

## 2019-07-03 NOTE — Progress Notes (Signed)
Early glucose testing ordered due to obesity in pregnancy .   Philip Aspen, CMN

## 2019-07-03 NOTE — Telephone Encounter (Signed)
-----   Message from Philip Aspen, [place] sent at 07/03/2019 11:10 AM EDT ----- Ivin Booty,    Can you please call Etana and let her know that due to her elevated BMI in pregnancy it is recommended that we collect an early glucose screen. Please have her schedule in for next week ( 1 hr) . Please instruct her on eating prior to the test. Thanks  Deneise Lever

## 2019-07-06 ENCOUNTER — Other Ambulatory Visit: Payer: 59

## 2019-07-06 ENCOUNTER — Other Ambulatory Visit: Payer: Self-pay

## 2019-07-06 DIAGNOSIS — Z3492 Encounter for supervision of normal pregnancy, unspecified, second trimester: Secondary | ICD-10-CM

## 2019-07-06 DIAGNOSIS — O99212 Obesity complicating pregnancy, second trimester: Secondary | ICD-10-CM

## 2019-07-07 ENCOUNTER — Other Ambulatory Visit: Payer: Self-pay | Admitting: Certified Nurse Midwife

## 2019-07-07 DIAGNOSIS — O99810 Abnormal glucose complicating pregnancy: Secondary | ICD-10-CM

## 2019-07-07 LAB — GLUCOSE, 1 HOUR GESTATIONAL: Gestational Diabetes Screen: 160 mg/dL — ABNORMAL HIGH (ref 65–139)

## 2019-07-07 NOTE — Progress Notes (Signed)
1 hr early glucose elevated, 3 hr ordered.   Philip Aspen, CNM

## 2019-07-17 ENCOUNTER — Other Ambulatory Visit: Payer: 59

## 2019-07-17 ENCOUNTER — Other Ambulatory Visit: Payer: Self-pay

## 2019-07-17 DIAGNOSIS — O99810 Abnormal glucose complicating pregnancy: Secondary | ICD-10-CM

## 2019-07-18 LAB — GESTATIONAL GLUCOSE TOLERANCE
Glucose, Fasting: 81 mg/dL (ref 65–94)
Glucose, GTT - 1 Hour: 143 mg/dL (ref 65–179)
Glucose, GTT - 2 Hour: 111 mg/dL (ref 65–154)
Glucose, GTT - 3 Hour: 68 mg/dL (ref 65–139)

## 2019-07-21 ENCOUNTER — Ambulatory Visit (INDEPENDENT_AMBULATORY_CARE_PROVIDER_SITE_OTHER): Payer: 59

## 2019-07-21 ENCOUNTER — Other Ambulatory Visit: Payer: Self-pay

## 2019-07-21 ENCOUNTER — Encounter: Payer: Self-pay | Admitting: Certified Nurse Midwife

## 2019-07-21 ENCOUNTER — Ambulatory Visit (INDEPENDENT_AMBULATORY_CARE_PROVIDER_SITE_OTHER): Payer: 59 | Admitting: Certified Nurse Midwife

## 2019-07-21 VITALS — BP 111/59 | HR 76 | Wt 278.0 lb

## 2019-07-21 DIAGNOSIS — Z3492 Encounter for supervision of normal pregnancy, unspecified, second trimester: Secondary | ICD-10-CM

## 2019-07-21 DIAGNOSIS — O99210 Obesity complicating pregnancy, unspecified trimester: Secondary | ICD-10-CM

## 2019-07-21 LAB — POCT URINALYSIS DIPSTICK OB
Bilirubin, UA: NEGATIVE
Blood, UA: NEGATIVE
Glucose, UA: NEGATIVE
Ketones, UA: NEGATIVE
Leukocytes, UA: NEGATIVE
Nitrite, UA: NEGATIVE
POC,PROTEIN,UA: NEGATIVE
Spec Grav, UA: 1.015 (ref 1.010–1.025)
Urobilinogen, UA: 0.2 E.U./dL
pH, UA: 6.5 (ref 5.0–8.0)

## 2019-07-21 NOTE — Progress Notes (Signed)
ROB doing well. Feels movement. Anatomy u/s today (see below). Incomplete . Reviewed results. Discussed  Follow up for completion of anatomy in 2 wks. ROB in 4. Discussed round ligament pain and normal discomforts of pregnancy. Pt verbalizes and agrees.  Patient Name: Jacqueline Abbott DOB: 02-Oct-1986 MRN: [id number]  ULTRASOUND REPORT  Location: [facility] Date of Service: 07/21/2019   Indications:Anatomy Ultrasound Findings:  [name] intrauterine pregnancy is visualized with FHR at 151 BPM. Biometrics give an (U/S) Gestational age of [redacted]w[redacted]d and an (U/S) EDD of 12/07/2019; this correlates with the clinically established Estimated Date of Delivery: 12/13/19  Fetal presentation is Variable.  EFW: 342 g ( 12 oz). Placenta: posterior. Grade: 1 AFI: subjectively normal.  Anatomic survey is incomplete for LVOT, RLOT and normal; Gender - female.    Right Ovary is normal in appearance. Left Ovary is normal appearance. Survey of the adnexa demonstrates no adnexal masses. There is no free peritoneal fluid in the cul de sac.  Impression: 1. [redacted]w[redacted]d Viable Singleton Intrauterine pregnancy by U/S. 2. (U/S) EDD is consistent with Clinically established Estimated Date of Delivery: 12/13/19 . 3. Normal Anatomy Scan 4 .Anatomic survey is incomplete for LVOT, RLOT  Recommendations: 1.Clinical correlation with the patient's History and Physical Exam.    Jenine M. Albertine Grates    RDMS

## 2019-07-21 NOTE — Patient Instructions (Signed)

## 2019-07-25 ENCOUNTER — Encounter
Admission: RE | Admit: 2019-07-25 | Discharge: 2019-07-25 | Disposition: A | Discharge status: Home / Self Care | Payer: 59 | Source: Ambulatory Visit | Attending: Anesthesiology | Admitting: Anesthesiology

## 2019-07-25 ENCOUNTER — Other Ambulatory Visit: Payer: Self-pay

## 2019-07-25 NOTE — Consult Note (Signed)
[facility] Anesthesia Consultation  TRENELL MOXEY WUJ:[id number] DOB: Sep 18, 1986 DOA: 07/25/2019 PCP: Joylene Igo, CNM   Requesting physician: Lorelle Gibbs, CNM Date of consultation: 07/25/19 Reason for consultation: Obesity during pregnancy  CHIEF COMPLAINT:  Obesity during pregnancy  HISTORY OF PRESENT ILLNESS: Cassandre Oleksy  is a 33 y.o. female with a known history of obesity in pregnancy. One prior vaginal delivery with epidural analgesia. Denies hx of cardiovascular disease. Denies hx of asthma. Denies personal or family hx of bleeding disorders.   PAST MEDICAL HISTORY:   Past Medical History:  Diagnosis Date  . Anxiety   . Hidradenitis   . Left ovarian cyst   . Obesity     PAST SURGICAL HISTORY:  Past Surgical History:  Procedure Laterality Date  . CHOLECYSTECTOMY, LAPAROSCOPIC  2011  . DILATION AND CURETTAGE OF UTERUS  09/2013  . HYSTEROSCOPY  09/2013    SOCIAL HISTORY:  Social History   Tobacco Use  . Smoking status: Current Every Day Smoker    Types: Cigarettes  . Smokeless tobacco: Never Used  Substance Use Topics  . Alcohol use: No    FAMILY HISTORY:  Family History  Problem Relation Age of Onset  . Hypertension Maternal Grandmother   . Heart disease Maternal Grandmother   . Arthritis Father     DRUG ALLERGIES:  Allergies  Allergen Reactions  . Shellfish Allergy Swelling    REVIEW OF SYSTEMS:   RESPIRATORY: No cough, shortness of breath, wheezing.  CARDIOVASCULAR: No chest pain, orthopnea, edema.  HEMATOLOGY: No anemia, easy bruising or bleeding SKIN: No rash or lesion. NEUROLOGIC: No tingling, numbness, weakness.  PSYCHIATRY: No anxiety or depression.   MEDICATIONS AT HOME:  Prior to Admission medications   Medication Sig Start Date End Date Taking? Authorizing Provider  citalopram (CELEXA) 20 MG tablet Take 1 tablet (20 mg total) by mouth daily. 05/25/19   Shambley, Melody N, CNM   metoCLOPramide (REGLAN) 10 MG tablet Take 1 tablet (10 mg total) by mouth 4 (four) times daily as needed for nausea or vomiting. 06/22/19   Lawhorn, Lara Mulch, CNM  ondansetron (ZOFRAN ODT) 4 MG disintegrating tablet Take 1 tablet (4 mg total) by mouth every 8 (eight) hours as needed for nausea or vomiting. 05/25/19   Shambley, Melody N, CNM      PHYSICAL EXAMINATION:   VITAL SIGNS: Last menstrual period 03/08/2019.  GENERAL:  33 y.o.-year-old patient no acute distress.  HEENT: Head atraumatic, normocephalic. Oropharynx and nasopharynx clear. MP 2, TM distance >3 cm, normal mouth opening, grade 1 upper lip bite LUNGS: Normal breath sounds bilaterally, no wheezing, rales,rhonchi. No use of accessory muscles of respiration.  CARDIOVASCULAR: S1, S2 normal. No murmurs, rubs, or gallops.  EXTREMITIES: No pedal edema, cyanosis, or clubbing.  NEUROLOGIC: normal gait PSYCHIATRIC: The patient is alert and oriented x 3.  SKIN: No obvious rash, lesion, or ulcer.    IMPRESSION AND PLAN:   Adin Lariccia  is a 33 y.o. female presenting with obesity during pregnancy. BMI is currently 44 at [redacted] weeks gestation.   Airway exam reassuring today. Spinal processes minimally palpable.   We discussed analgesic options during labor including epidural analgesia. Discussed that in obesity there can be increased difficulty with epidural placement or even failure of successful epidural. We also discussed that even after successful epidural placement there is increased risk of catheter migration out of the epidural space that would require catheter replacement. Discussed use of epidural vs spinal vs GA if cesarean  delivery is required. Discussed increased risk of difficult intubation during pregnancy should an emergency cesarean delivery be required.   We discussed repeat evaluation at 34-36 weeks by anesthesia to determine whether there is a high risk of complications of anesthesia for which we would  recommend transfer of OB care to a facility with a higher maternal level of care designation.

## 2019-08-03 ENCOUNTER — Other Ambulatory Visit: Payer: Self-pay

## 2019-08-03 ENCOUNTER — Ambulatory Visit (INDEPENDENT_AMBULATORY_CARE_PROVIDER_SITE_OTHER): Payer: 59

## 2019-08-03 DIAGNOSIS — Z3492 Encounter for supervision of normal pregnancy, unspecified, second trimester: Secondary | ICD-10-CM

## 2019-08-17 ENCOUNTER — Encounter: Payer: 59 | Admitting: Certified Nurse Midwife

## 2019-08-17 ENCOUNTER — Encounter: Payer: 59 | Admitting: Obstetrics and Gynecology

## 2019-08-18 ENCOUNTER — Encounter: Payer: 59 | Admitting: Certified Nurse Midwife

## 2019-08-21 ENCOUNTER — Encounter: Payer: Self-pay | Admitting: Certified Nurse Midwife

## 2019-08-21 ENCOUNTER — Ambulatory Visit (INDEPENDENT_AMBULATORY_CARE_PROVIDER_SITE_OTHER): Payer: 59 | Admitting: Certified Nurse Midwife

## 2019-08-21 ENCOUNTER — Other Ambulatory Visit: Payer: Self-pay

## 2019-08-21 VITALS — BP 116/67 | HR 89 | Wt 283.5 lb

## 2019-08-21 DIAGNOSIS — Z3402 Encounter for supervision of normal first pregnancy, second trimester: Secondary | ICD-10-CM

## 2019-08-21 NOTE — Patient Instructions (Signed)

## 2019-08-21 NOTE — Progress Notes (Signed)
Body mass index is 45.76 kg/m. ROB, Doing well. PT had anesthesia consult early in pregnancy , will repeat @ 35 wks. Discussed 28 wks labs, she will have 3 hr glucose done ( she had to have early 3 hr). Gad score 3, doing well. Feels good movement. Follow up 4 wks.   Philip Aspen, CNM

## 2019-08-22 LAB — POCT URINALYSIS DIPSTICK OB
Bilirubin, UA: NEGATIVE
Glucose, UA: NEGATIVE
Ketones, UA: NEGATIVE
Leukocytes, UA: NEGATIVE
Nitrite, UA: NEGATIVE
POC,PROTEIN,UA: NEGATIVE
Spec Grav, UA: >=1.03 — AB (ref 1.010–1.025)
Urobilinogen, UA: 0.2 E.U./dL
pH, UA: 6.5 (ref 5.0–8.0)

## 2019-08-22 NOTE — Addendum Note (Signed)
Addended by: Raliegh Ip on: 08/22/2019 10:43 AM   Modules accepted: Orders

## 2019-09-18 ENCOUNTER — Other Ambulatory Visit: Payer: 59

## 2019-09-18 ENCOUNTER — Encounter: Payer: 59 | Admitting: Certified Nurse Midwife

## 2019-09-21 ENCOUNTER — Ambulatory Visit (INDEPENDENT_AMBULATORY_CARE_PROVIDER_SITE_OTHER): Payer: 59 | Admitting: Obstetrics and Gynecology

## 2019-09-21 ENCOUNTER — Other Ambulatory Visit: Payer: Self-pay

## 2019-09-21 ENCOUNTER — Encounter: Payer: Self-pay | Admitting: Obstetrics and Gynecology

## 2019-09-21 VITALS — BP 121/79 | HR 92 | Wt 287.6 lb

## 2019-09-21 DIAGNOSIS — F419 Anxiety disorder, unspecified: Secondary | ICD-10-CM

## 2019-09-21 DIAGNOSIS — O99810 Abnormal glucose complicating pregnancy: Secondary | ICD-10-CM | POA: Insufficient documentation

## 2019-09-21 DIAGNOSIS — Z23 Encounter for immunization: Secondary | ICD-10-CM

## 2019-09-21 DIAGNOSIS — O0993 Supervision of high risk pregnancy, unspecified, third trimester: Secondary | ICD-10-CM

## 2019-09-21 DIAGNOSIS — Z3480 Encounter for supervision of other normal pregnancy, unspecified trimester: Secondary | ICD-10-CM

## 2019-09-21 DIAGNOSIS — O0990 Supervision of high risk pregnancy, unspecified, unspecified trimester: Secondary | ICD-10-CM

## 2019-09-21 DIAGNOSIS — O99342 Other mental disorders complicating pregnancy, second trimester: Secondary | ICD-10-CM

## 2019-09-21 DIAGNOSIS — Z6841 Body Mass Index (BMI) 40.0 and over, adult: Secondary | ICD-10-CM

## 2019-09-21 DIAGNOSIS — Z720 Tobacco use: Secondary | ICD-10-CM | POA: Insufficient documentation

## 2019-09-21 DIAGNOSIS — Z3A28 28 weeks gestation of pregnancy: Secondary | ICD-10-CM

## 2019-09-21 NOTE — Progress Notes (Signed)
Its a boy  tdap today  Last pap 10/26/2018

## 2019-09-21 NOTE — Progress Notes (Signed)
Prenatal Visit Note Date: 09/21/2019 Clinic: [facility]  Subjective:  Jacqueline Abbott is a 33 y.o. G2P1001 at [redacted]w[redacted]d being seen today for ongoing prenatal care.  She is currently monitored for the following issues for this low-risk pregnancy and has GERD (gastroesophageal reflux disease); Anxiety; Hypercholesteremia; Hidradenitis axillaris; Obesity in pregnancy; Nausea and vomiting during pregnancy prior to [redacted] weeks gestation; BMI 45.0-49.9, adult ([place]); Tobacco abuse; Abnormal glucose affecting pregnancy; and Supervision of high risk pregnancy, antepartum on their problem list.  Patient reports no complaints.   Contractions: Not present. Vag. Bleeding: None.  Movement: Present. Denies leaking of fluid.   The following portions of the patient's history were reviewed and updated as appropriate: allergies, current medications, past family history, past medical history, past social history, past surgical history and problem list. Problem list updated.  Objective:   Vitals:   09/21/19 0834  BP: 121/79  Pulse: 92  Weight: 287 lb 9.6 oz (130.5 kg)    Fetal Status: Fetal Heart Rate (bpm): 145 Fundal Height: 29 cm Movement: Present     General:  Alert, oriented and cooperative. Patient is in no acute distress.  Skin: Skin is warm and dry. No rash noted.   Cardiovascular: Normal heart rate noted  Respiratory: Normal respiratory effort, no problems with respiration noted  Abdomen: Soft, gravid, appropriate for gestational age. Pain/Pressure: Absent     Pelvic:  Cervical exam deferred        Extremities: Normal range of motion.     Mental Status: Normal mood and affect. Normal behavior. Normal judgment and thought content.   Urinalysis:      Assessment and Plan:  Pregnancy: G2P1001 at [redacted]w[redacted]d  1. Normal pregnancy in multigravida Routine care.  - 2 Hour GTT - CBC - RPR - HIV antibody - Enroll Patient in Babyscripts - Antibody screen  2. BMI 45.0-49.9, adult  ([place]) Back to close to pre-pregnancy weight  3. Tobacco abuse  4. Abnormal glucose affecting pregnancy   5. Supervision of high risk pregnancy, antepartum D/w her re: practice set up, etc and can do virtual or in person  6. Anxiety Continue celexa 20 qday  Preterm labor symptoms and general obstetric precautions including but not limited to vaginal bleeding, contractions, leaking of fluid and fetal movement were reviewed in detail with the patient. Please refer to After Visit Summary for other counseling recommendations.  Return in about 2 weeks (around 10/05/2019) for low risk, in person.   Aletha Halim, MD

## 2019-09-23 LAB — ANTIBODY SCREEN

## 2019-09-25 ENCOUNTER — Other Ambulatory Visit: Payer: 59

## 2019-09-25 ENCOUNTER — Other Ambulatory Visit: Payer: Self-pay

## 2019-09-26 LAB — ANTIBODY SCREEN: Antibody Screen: NEGATIVE

## 2019-09-28 LAB — GLUCOSE TOLERANCE, 2 HOURS W/ 1HR
Glucose, 2 hour: 82 mg/dL (ref 65–152)
Glucose, Fasting: 72 mg/dL (ref 65–91)

## 2019-09-28 LAB — HIV ANTIBODY (ROUTINE TESTING W REFLEX): HIV Screen 4th Generation wRfx: NONREACTIVE

## 2019-09-28 LAB — CBC
Hematocrit: 36.6 % (ref 34.0–46.6)
Hemoglobin: 12.4 g/dL (ref 11.1–15.9)
MCH: 31.2 pg (ref 26.6–33.0)
MCHC: 33.9 g/dL (ref 31.5–35.7)
MCV: 92 fL (ref 79–97)
Platelets: 275 10*3/uL (ref 150–450)
RBC: 3.97 x10E6/uL (ref 3.77–5.28)
RDW: 12.3 % (ref 11.7–15.4)
WBC: 8.7 10*3/uL (ref 3.4–10.8)

## 2019-09-28 LAB — RPR: RPR Ser Ql: NONREACTIVE

## 2019-10-02 ENCOUNTER — Telehealth: Payer: Self-pay | Admitting: *Deleted

## 2019-10-02 NOTE — Telephone Encounter (Signed)
-----   Message from Aletha Halim, MD sent at 10/02/2019  8:43 AM EST ----- Can you call her and let her know that her 1 hour specimen couldn't be run, unfortunately. Can you ask her if she can come in for another 2h GTT. Everything else was normal. thanks

## 2019-10-02 NOTE — Telephone Encounter (Signed)
Pt informed of lab results and needing to repeat 2 hr gtt. Appointment was made.

## 2019-10-04 ENCOUNTER — Encounter: Payer: Self-pay | Admitting: Family Medicine

## 2019-10-04 ENCOUNTER — Other Ambulatory Visit: Payer: Self-pay

## 2019-10-04 ENCOUNTER — Ambulatory Visit (INDEPENDENT_AMBULATORY_CARE_PROVIDER_SITE_OTHER): Payer: 59 | Admitting: Family Medicine

## 2019-10-04 VITALS — BP 107/70 | HR 114 | Wt 290.0 lb

## 2019-10-04 DIAGNOSIS — O99213 Obesity complicating pregnancy, third trimester: Secondary | ICD-10-CM

## 2019-10-04 DIAGNOSIS — O0993 Supervision of high risk pregnancy, unspecified, third trimester: Secondary | ICD-10-CM

## 2019-10-04 DIAGNOSIS — O0990 Supervision of high risk pregnancy, unspecified, unspecified trimester: Secondary | ICD-10-CM

## 2019-10-04 DIAGNOSIS — Z720 Tobacco use: Secondary | ICD-10-CM

## 2019-10-04 DIAGNOSIS — O99810 Abnormal glucose complicating pregnancy: Secondary | ICD-10-CM

## 2019-10-04 DIAGNOSIS — Z3A30 30 weeks gestation of pregnancy: Secondary | ICD-10-CM

## 2019-10-04 DIAGNOSIS — O99210 Obesity complicating pregnancy, unspecified trimester: Secondary | ICD-10-CM

## 2019-10-04 NOTE — Progress Notes (Signed)
   PRENATAL VISIT NOTE  Subjective:  Jacqueline Abbott is a 33 y.o. G2P1001 at [redacted]w[redacted]d being seen today for ongoing prenatal care.  She is currently monitored for the following issues for this high-risk pregnancy and has GERD (gastroesophageal reflux disease); Anxiety; Hypercholesteremia; Hidradenitis axillaris; Obesity in pregnancy; Nausea and vomiting during pregnancy prior to [redacted] weeks gestation; BMI 45.0-49.9, adult ([place]); Tobacco abuse; Abnormal glucose affecting pregnancy; and Supervision of high risk pregnancy, antepartum on their problem list.  Patient reports no complaints.  Contractions: Not present. Vag. Bleeding: None.  Movement: Present. Denies leaking of fluid.   The following portions of the patient's history were reviewed and updated as appropriate: allergies, current medications, past family history, past medical history, past social history, past surgical history and problem list.   Objective:   Vitals:   10/04/19 1624  BP: 107/70  Pulse: (!) 114  Weight: 290 lb (131.5 kg)    Fetal Status: Fetal Heart Rate (bpm): 147   Movement: Present     General:  Alert, oriented and cooperative. Patient is in no acute distress.  Skin: Skin is warm and dry. No rash noted.   Cardiovascular: Normal heart rate noted  Respiratory: Normal respiratory effort, no problems with respiration noted  Abdomen: Soft, gravid, appropriate for gestational age.  Pain/Pressure: Absent     Pelvic: Cervical exam deferred        Extremities: Normal range of motion.  Edema: None  Mental Status: Normal mood and affect. Normal behavior. Normal judgment and thought content.   Assessment and Plan:  Pregnancy: G2P1001 at [redacted]w[redacted]d  1. Abnormal glucose affecting pregnancy 2hr last visit missed 1 hr  Scheduled for repeat next thursday  2. Obesity in pregnancy TWG=19 lb (8.618 kg) which is above goal.   3. Supervision of high risk pregnancy, antepartum Up to date  4. Tobacco abuse Quit tobacco 1 month  ago. Used motivational interviewing to discuss barriers to tobacco cessation. Spent 3-10 minutes in direct counseling about ways to reduce use with the goal of eventual cessation. Reviewed health concerns for pregnancy and infant related to tobacco use.  Discussed gradual reduction and replacement of cigs with other activities.  Patient stated goal: stay tobacco free  Preterm labor symptoms and general obstetric precautions including but not limited to vaginal bleeding, contractions, leaking of fluid and fetal movement were reviewed in detail with the patient. Please refer to After Visit Summary for other counseling recommendations.   Return in about 2 weeks (around 10/18/2019) for Routine prenatal care, Telehealth/Virtual health OB Visit.  Future Appointments  Date Time Provider [place]  10/12/2019  8:15 AM [facility]  10/18/2019  2:45 PM Caren Macadam, MD [facility]    Caren Macadam, MD

## 2019-10-06 NOTE — L&D Delivery Note (Signed)
Operative Delivery Note At 3:40 AM a viable female was delivered via Vaginal, [employer], operational).  Presentation: vertex; Position: Occiput,, Posterior; Station: +2. Nuchal cord x 1, terminal meconium  Verbal consent: obtained from patient.  Risks and benefits discussed in detail.  Risks include, but are not limited to the risks of anesthesia, bleeding, infection, damage to maternal tissues, fetal cephalhematoma.  There is also the risk of inability to effect vaginal delivery of the head, or shoulder dystocia that cannot be resolved by established maneuvers, leading to the need for emergency cesarean section.  APGAR:7 , 9.   Placenta status: whole and intact , 3 vessel cord .     Anesthesia:  epidural Instruments: Kiwi Episiotomy:  none Lacerations:  1st degree and left labial Suture Repair: 3.0 vicryl Est. Blood Loss (mL):  250  Mom to postpartum.  Baby to Couplet care / Skin to Skin.  Shelagh Rayman 12/07/2019, 4:01 AM

## 2019-10-12 ENCOUNTER — Other Ambulatory Visit: Payer: 59

## 2019-10-18 ENCOUNTER — Telehealth (INDEPENDENT_AMBULATORY_CARE_PROVIDER_SITE_OTHER): Payer: 59 | Admitting: Family Medicine

## 2019-10-18 ENCOUNTER — Other Ambulatory Visit: Payer: Self-pay

## 2019-10-18 ENCOUNTER — Encounter: Payer: Self-pay | Admitting: Family Medicine

## 2019-10-18 DIAGNOSIS — Z3A32 32 weeks gestation of pregnancy: Secondary | ICD-10-CM

## 2019-10-18 DIAGNOSIS — O0993 Supervision of high risk pregnancy, unspecified, third trimester: Secondary | ICD-10-CM

## 2019-10-18 DIAGNOSIS — U071 COVID-19: Secondary | ICD-10-CM | POA: Insufficient documentation

## 2019-10-18 DIAGNOSIS — O98513 Other viral diseases complicating pregnancy, third trimester: Secondary | ICD-10-CM

## 2019-10-18 DIAGNOSIS — O0990 Supervision of high risk pregnancy, unspecified, unspecified trimester: Secondary | ICD-10-CM

## 2019-10-18 NOTE — Progress Notes (Signed)
Patient is not taking her blood pressure

## 2019-10-18 NOTE — Progress Notes (Signed)
I connected with@ on 10/18/19 at  2:45 PM EST by: MyChart and verified that I am speaking with the correct person using two identifiers.  Patient is located at home and provider is located at [facility].     The purpose of this virtual visit is to provide medical care while limiting exposure to the novel coronavirus. I discussed the limitations, risks, security and privacy concerns of performing an evaluation and management service by Mychart and the availability of in person appointments. I also discussed with the patient that there may be a patient responsible charge related to this service. By engaging in this virtual visit, you consent to the provision of healthcare.  Additionally, you authorize for your insurance to be billed for the services provided during this visit.  The patient expressed understanding and agreed to proceed.  The following staff members participated in the virtual visit:  Demetrice Cheree Ditto    PRENATAL VISIT NOTE  Subjective:  Jacqueline Abbott is a 34 y.o. G2P1001 at [redacted]w[redacted]d  for phone visit for ongoing prenatal care.  She is currently monitored for the following issues for this low-risk pregnancy and has GERD (gastroesophageal reflux disease); Anxiety; Hypercholesteremia; Hidradenitis axillaris; Obesity in pregnancy; Nausea and vomiting during pregnancy prior to [redacted] weeks gestation; BMI 45.0-49.9, adult ([facility]); Tobacco abuse; Abnormal glucose affecting pregnancy; Supervision of high risk pregnancy, antepartum; and COVID-19 affecting pregnancy in third trimester on their problem list.  Patient reports recently dx with COVID 19.  Contractions: Not present.  .  Movement: Present. Denies leaking of fluid.   The following portions of the patient's history were reviewed and updated as appropriate: allergies, current medications, past family history, past medical history, past social history, past surgical history and problem list.   Objective:   Vitals:   10/18/19 1453  Weight: 296  lb (134.3 kg)   Self-Obtained  Fetal Status:     Movement: Present     Assessment and Plan:  Pregnancy: G2P1001 at [redacted]w[redacted]d  1. Supervision of high risk pregnancy, antepartum Up to date Has appt to repeat 2hr  2. COVID-19 affecting pregnancy in third trimester Off isolation 1/10 Partner and child are in quarantine until 1/22 Reviewed public health measures in detail  Preterm labor symptoms and general obstetric precautions including but not limited to vaginal bleeding, contractions, leaking of fluid and fetal movement were reviewed in detail with the patient.  Return in about 2 weeks (around 11/01/2019) for Routine prenatal care, Telehealth/Virtual health OB Visit.  Future Appointments  Date Time Provider Department Center  10/19/2019  8:15 AM [facility]  11/02/2019  2:30 PM Reva Bores, MD [facility]     Time spent on virtual visit: 20 minutes  Federico Flake, MD

## 2019-10-18 NOTE — Patient Instructions (Signed)
Here are the pregnancy registries for COVID 19   [email or web address]

## 2019-10-19 ENCOUNTER — Other Ambulatory Visit: Payer: Self-pay

## 2019-10-19 ENCOUNTER — Other Ambulatory Visit: Payer: 59

## 2019-10-19 DIAGNOSIS — O0990 Supervision of high risk pregnancy, unspecified, unspecified trimester: Secondary | ICD-10-CM

## 2019-10-20 LAB — GLUCOSE TOLERANCE, 2 HOURS W/ 1HR
Glucose, 1 hour: 131 mg/dL (ref 65–179)
Glucose, 2 hour: 128 mg/dL (ref 65–152)
Glucose, Fasting: 78 mg/dL (ref 65–91)

## 2019-11-01 ENCOUNTER — Encounter: Payer: 59 | Admitting: Obstetrics and Gynecology

## 2019-11-02 ENCOUNTER — Other Ambulatory Visit: Payer: Self-pay

## 2019-11-02 ENCOUNTER — Telehealth (INDEPENDENT_AMBULATORY_CARE_PROVIDER_SITE_OTHER): Payer: 59 | Admitting: Family Medicine

## 2019-11-02 DIAGNOSIS — U071 COVID-19: Secondary | ICD-10-CM

## 2019-11-02 DIAGNOSIS — O0990 Supervision of high risk pregnancy, unspecified, unspecified trimester: Secondary | ICD-10-CM

## 2019-11-02 DIAGNOSIS — O98513 Other viral diseases complicating pregnancy, third trimester: Secondary | ICD-10-CM

## 2019-11-02 DIAGNOSIS — O0993 Supervision of high risk pregnancy, unspecified, third trimester: Secondary | ICD-10-CM

## 2019-11-02 DIAGNOSIS — Z3A34 34 weeks gestation of pregnancy: Secondary | ICD-10-CM

## 2019-11-02 NOTE — Patient Instructions (Signed)

## 2019-11-02 NOTE — Progress Notes (Signed)
I connected with  Jacqueline Abbott on 11/02/19 at  2:30 PM EST by telephone and verified that I am speaking with the correct person using two identifiers.   I discussed the limitations, risks, security and privacy concerns of performing an evaluation and management service by telephone and the availability of in person appointments. I also discussed with the patient that there may be a patient responsible charge related to this service. The patient expressed understanding and agreed to proceed.  Scheryl Marten, RN 11/02/2019  2:34 PM

## 2019-11-03 NOTE — Progress Notes (Signed)
    TELEHEALTH OBSTETRICS PRENATAL VIRTUAL VIDEO VISIT ENCOUNTER NOTE  Provider location: [facility]   I connected with [name] on 11/03/19 at  2:30 PM EST by MyChart Video Encounter at home and verified that I am speaking with the correct person using two identifiers.   I discussed the limitations, risks, security and privacy concerns of performing an evaluation and management service virtually and the availability of in person appointments. I also discussed with the patient that there may be a patient responsible charge related to this service. The patient expressed understanding and agreed to proceed. Subjective:  Jacqueline Abbott is a 34 y.o. G2P1001 at [redacted]w[redacted]d being seen today for ongoing prenatal care.  She is currently monitored for the following issues for this high-risk pregnancy and has GERD (gastroesophageal reflux disease); Anxiety; Hypercholesteremia; Hidradenitis axillaris; Obesity in pregnancy; Nausea and vomiting during pregnancy prior to [redacted] weeks gestation; BMI 45.0-49.9, adult ([facility]); Tobacco abuse; Abnormal glucose affecting pregnancy; Supervision of high risk pregnancy, antepartum; and COVID-19 affecting pregnancy in third trimester on their problem list.  Patient reports no complaints.  Contractions: Not present. Vag. Bleeding: None.  Movement: Present. Denies any leaking of fluid.   The following portions of the patient's history were reviewed and updated as appropriate: allergies, current medications, past family history, past medical history, past social history, past surgical history and problem list.   Objective:  There were no vitals filed for this visit.  Fetal Status:     Movement: Present     General:  Alert, oriented and cooperative. Patient is in no acute distress.  Respiratory: Normal respiratory effort, no problems with respiration noted  Mental Status: Normal mood and affect. Normal behavior. Normal judgment and  thought content.  Rest of physical exam deferred due to type of encounter  Imaging: No results found.  Assessment and Plan:  Pregnancy: G2P1001 at [redacted]w[redacted]d 1. COVID-19 affecting pregnancy in third trimester Improving symptoms  2. Supervision of high risk pregnancy, antepartum 36 wk cultures next visit.  Preterm labor symptoms and general obstetric precautions including but not limited to vaginal bleeding, contractions, leaking of fluid and fetal movement were reviewed in detail with the patient. I discussed the assessment and treatment plan with the patient. The patient was provided an opportunity to ask questions and all were answered. The patient agreed with the plan and demonstrated an understanding of the instructions. The patient was advised to call back or seek an in-person office evaluation/go to [facility] at [facility] for any urgent or concerning symptoms. Please refer to After Visit Summary for other counseling recommendations.   I provided 11 minutes of face-to-face time during this encounter.  Return in 2 weeks (on 11/16/2019) for in person 36 wk cultures.  Future Appointments  Date Time Provider Department Center  11/16/2019 11:00 AM Reva Bores, MD [facility]    Reva Bores, MD Center for [facility], [facility]

## 2019-11-16 ENCOUNTER — Other Ambulatory Visit: Payer: Self-pay

## 2019-11-16 ENCOUNTER — Ambulatory Visit (INDEPENDENT_AMBULATORY_CARE_PROVIDER_SITE_OTHER): Payer: 59 | Admitting: Family Medicine

## 2019-11-16 VITALS — BP 123/78 | HR 94 | Wt 302.0 lb

## 2019-11-16 DIAGNOSIS — Z3A36 36 weeks gestation of pregnancy: Secondary | ICD-10-CM

## 2019-11-16 DIAGNOSIS — O26893 Other specified pregnancy related conditions, third trimester: Secondary | ICD-10-CM

## 2019-11-16 DIAGNOSIS — Z113 Encounter for screening for infections with a predominantly sexual mode of transmission: Secondary | ICD-10-CM

## 2019-11-16 DIAGNOSIS — O0990 Supervision of high risk pregnancy, unspecified, unspecified trimester: Secondary | ICD-10-CM

## 2019-11-16 NOTE — Progress Notes (Signed)
   PRENATAL VISIT NOTE  Subjective:  Jacqueline Abbott is a 34 y.o. G2P1001 at [redacted]w[redacted]d being seen today for ongoing prenatal care.  She is currently monitored for the following issues for this high-risk pregnancy and has GERD (gastroesophageal reflux disease); Anxiety; Hypercholesteremia; Hidradenitis axillaris; Obesity in pregnancy; Nausea and vomiting during pregnancy prior to [redacted] weeks gestation; BMI 45.0-49.9, adult ([facility]); Tobacco abuse; Abnormal glucose affecting pregnancy; Supervision of high risk pregnancy, antepartum; and COVID-19 affecting pregnancy in third trimester on their problem list.  Patient reports no complaints.  Contractions: Not present. Vag. Bleeding: None.  Movement: Present. Denies leaking of fluid.   The following portions of the patient's history were reviewed and updated as appropriate: allergies, current medications, past family history, past medical history, past social history, past surgical history and problem list.   Objective:   Vitals:   11/16/19 1100  BP: 123/78  Pulse: 94  Weight: (!) 302 lb (137 kg)    Fetal Status: Fetal Heart Rate (bpm): 141 Fundal Height: 39 cm Movement: Present  Presentation: Vertex  General:  Alert, oriented and cooperative. Patient is in no acute distress.  Skin: Skin is warm and dry. No rash noted.   Cardiovascular: Normal heart rate noted  Respiratory: Normal respiratory effort, no problems with respiration noted  Abdomen: Soft, gravid, appropriate for gestational age.  Pain/Pressure: Present     Pelvic: Cervical exam performed Dilation: Fingertip Effacement (%): 50 Station: -3  Extremities: Normal range of motion.  Edema: Trace  Mental Status: Normal mood and affect. Normal behavior. Normal judgment and thought content.   Assessment and Plan:  Pregnancy: G2P1001 at [redacted]w[redacted]d 1. Supervision of high risk pregnancy, antepartum Cultures today - Strep Gp B NAA - GC/Chlamydia probe amp ([place])not at [facility]  Preterm labor  symptoms and general obstetric precautions including but not limited to vaginal bleeding, contractions, leaking of fluid and fetal movement were reviewed in detail with the patient. Please refer to After Visit Summary for other counseling recommendations.   Return in 1 week (on 11/23/2019) for virtual.  Future Appointments  Date Time Provider Department Center  11/22/2019 11:30 AM Calvert Cantor, CNM [facility]    Reva Bores, MD

## 2019-11-16 NOTE — Patient Instructions (Signed)

## 2019-11-17 LAB — GC/CHLAMYDIA PROBE AMP (~~LOC~~) NOT AT ARMC
Chlamydia: NEGATIVE
Comment: NEGATIVE
Comment: NORMAL
Neisseria Gonorrhea: NEGATIVE

## 2019-11-18 LAB — STREP GP B NAA: Strep Gp B NAA: NEGATIVE

## 2019-11-22 ENCOUNTER — Telehealth (INDEPENDENT_AMBULATORY_CARE_PROVIDER_SITE_OTHER): Payer: 59 | Admitting: Advanced Practice Midwife

## 2019-11-22 ENCOUNTER — Other Ambulatory Visit: Payer: Self-pay

## 2019-11-22 ENCOUNTER — Inpatient Hospital Stay (HOSPITAL_COMMUNITY): Admission: AD | Admit: 2019-11-22 | Payer: 59 | Source: Home / Self Care | Admitting: Obstetrics and Gynecology

## 2019-11-22 DIAGNOSIS — O0993 Supervision of high risk pregnancy, unspecified, third trimester: Secondary | ICD-10-CM

## 2019-11-22 DIAGNOSIS — Z3A37 37 weeks gestation of pregnancy: Secondary | ICD-10-CM

## 2019-11-22 DIAGNOSIS — O0990 Supervision of high risk pregnancy, unspecified, unspecified trimester: Secondary | ICD-10-CM

## 2019-11-22 NOTE — Patient Instructions (Signed)

## 2019-11-22 NOTE — Progress Notes (Signed)
Dr Shawnie Pons told her we would induce her at 39 weeks

## 2019-11-22 NOTE — Progress Notes (Signed)
   TELEHEALTH OBSTETRICS PRENATAL VIRTUAL VIDEO VISIT ENCOUNTER NOTE  Provider location: Center for [facility]   I connected with Jacqueline Abbott on 11/22/31 at 11:30 AM EST by MyChart Video Encounter at home and verified that I am speaking with the correct person using two identifiers.   I discussed the limitations, risks, security and privacy concerns of performing an evaluation and management service virtually and the availability of in person appointments. I also discussed with the patient that there may be a patient responsible charge related to this service. The patient expressed understanding and agreed to proceed. Subjective:  Jacqueline Abbott is a 34 y.o. G2P1001 at [redacted]w[redacted]d being seen today for ongoing prenatal care.  She is currently monitored for the following issues for this high-risk pregnancy and has GERD (gastroesophageal reflux disease); Anxiety; Hypercholesteremia; Hidradenitis axillaris; Obesity in pregnancy; Nausea and vomiting during pregnancy prior to [redacted] weeks gestation; BMI 45.0-49.9, adult (HCC); Tobacco abuse; Abnormal glucose affecting pregnancy; Supervision of high risk pregnancy, antepartum; and COVID-19 affecting pregnancy in third trimester on their problem list.  Patient reports no complaints.  Contractions: Not present. Vag. Bleeding: None.  Movement: Present. Denies any leaking of fluid.   The following portions of the patient's history were reviewed and updated as appropriate: allergies, current medications, past family history, past medical history, past social history, past surgical history and problem list.   Objective:  There were no vitals filed for this visit.  Fetal Status:     Movement: Present     General:  Alert, oriented and cooperative. Patient is in no acute distress.  Respiratory: Normal respiratory effort, no problems with respiration noted  Mental Status: Normal mood and affect. Normal behavior. Normal judgment and  thought content.  Rest of physical exam deferred due to type of encounter  Imaging: No results found.  Assessment and Plan:  Pregnancy: G2P1001 at [redacted]w[redacted]d 1. Supervision of high risk pregnancy, antepartum - Routine care - Previously requested elective IOL at 39 weeks. Scheduled for 12/06/2019. Orders placed for Cytotec - Reviewed protocol for phone call day of induction as early as 0500, possibility that due to the elective nature of her induction she will may be rescheduled if high census.   Term labor symptoms and general obstetric precautions including but not limited to vaginal bleeding, contractions, leaking of fluid and fetal movement were reviewed in detail with the patient. I discussed the assessment and treatment plan with the patient. The patient was provided an opportunity to ask questions and all were answered. The patient agreed with the plan and demonstrated an understanding of the instructions. The patient was advised to call back or seek an in-person office evaluation/go to MAU at [facility] for any urgent or concerning symptoms. Please refer to After Visit Summary for other counseling recommendations.   I provided ten minutes of face-to-face time during this encounter.  Return in about 1 week (around 11/29/2019).  Future Appointments  Date Time Provider Department Center  11/28/2019  1:00 PM McCracken Bing, MD [facility]    Calvert Cantor, CNM Center for [employer], [facility]

## 2019-11-24 ENCOUNTER — Telehealth (HOSPITAL_COMMUNITY): Payer: Self-pay | Admitting: *Deleted

## 2019-11-24 NOTE — Telephone Encounter (Signed)
Preadmission screen  

## 2019-11-27 ENCOUNTER — Telehealth (HOSPITAL_COMMUNITY): Payer: Self-pay | Admitting: *Deleted

## 2019-11-27 ENCOUNTER — Encounter (HOSPITAL_COMMUNITY): Payer: Self-pay | Admitting: *Deleted

## 2019-11-27 NOTE — Telephone Encounter (Signed)
Preadmission screen  

## 2019-11-28 ENCOUNTER — Other Ambulatory Visit: Payer: Self-pay | Admitting: Advanced Practice Midwife

## 2019-11-28 ENCOUNTER — Other Ambulatory Visit: Payer: Self-pay

## 2019-11-28 ENCOUNTER — Telehealth (INDEPENDENT_AMBULATORY_CARE_PROVIDER_SITE_OTHER): Payer: 59 | Admitting: Obstetrics and Gynecology

## 2019-11-28 DIAGNOSIS — Z3A37 37 weeks gestation of pregnancy: Secondary | ICD-10-CM

## 2019-11-28 DIAGNOSIS — O98513 Other viral diseases complicating pregnancy, third trimester: Secondary | ICD-10-CM

## 2019-11-28 DIAGNOSIS — U071 COVID-19: Secondary | ICD-10-CM

## 2019-11-28 NOTE — Progress Notes (Signed)
   TELEHEALTH VIRTUAL OBSTETRICS VISIT ENCOUNTER NOTE  Clinic: [facility]  I connected with Jacqueline Abbott on 11/28/19 at  1:00 PM EST by telephone at home and verified that I am speaking with the correct person using two identifiers.   I discussed the limitations, risks, security and privacy concerns of performing an evaluation and management service by telephone and the availability of in person appointments. I also discussed with the patient that there may be a patient responsible charge related to this service. The patient expressed understanding and agreed to proceed.  Subjective:  Jacqueline Abbott is a 34 y.o. G2P1001 at [redacted]w[redacted]d being followed for ongoing prenatal care.  She is currently monitored for the following issues for this low-risk pregnancy and has GERD (gastroesophageal reflux disease); Anxiety; Hypercholesteremia; Hidradenitis axillaris; Obesity in pregnancy; Nausea and vomiting during pregnancy prior to [redacted] weeks gestation; BMI 45.0-49.9, adult (HCC); Tobacco abuse; Abnormal glucose affecting pregnancy; Supervision of high risk pregnancy, antepartum; and COVID-19 affecting pregnancy in third trimester on their problem list.  Patient reports no complaints. Reports fetal movement. Denies any contractions, bleeding or leaking of fluid.   The following portions of the patient's history were reviewed and updated as appropriate: allergies, current medications, past family history, past medical history, past social history, past surgical history and problem list.   Objective:   Vitals:   11/28/19 1303  BP: 130/83    Babyscripts Data Reviewed: not applicable  General:  Alert, oriented and cooperative.   Mental Status: Normal mood and affect perceived. Normal judgment and thought content.  Rest of physical exam deferred due to type of encounter  Assessment and Plan:  Pregnancy: G2P1001 at [redacted]w[redacted]d 1. Pregnancy No issues. Already set up for 39wk elective  IOL  Term labor symptoms and general obstetric precautions including but not limited to vaginal bleeding, contractions, leaking of fluid and fetal movement were reviewed in detail with the patient.  I discussed the assessment and treatment plan with the patient. The patient was provided an opportunity to ask questions and all were answered. The patient agreed with the plan and demonstrated an understanding of the instructions. The patient was advised to call back or seek an in-person office evaluation/go to MAU at [facility] for any urgent or concerning symptoms. Please refer to After Visit Summary for other counseling recommendations.   I provided 7 minutes of non-face-to-face time during this encounter. The visit was conducted via Telephone (Video not working)-medicine  Return in about 1 week (around 12/05/2019) for virtual visit, low risk.  Future Appointments  Date Time Provider Department Center  12/04/2019  9:45 AM [facility]-SCREENING [facility] None  12/06/2019  7:00 AM [facility]-LD SCHED ROOM [facility] None    [place] Bing, MD Center for [facility], [facility]

## 2019-11-29 ENCOUNTER — Other Ambulatory Visit: Payer: Self-pay | Admitting: Advanced Practice Midwife

## 2019-12-02 NOTE — Progress Notes (Signed)
Pt will not be retested for covid on Mon 3/1, due to multiple notes notes stating the pt tested + on 10/10/19 for covid. Therefore, the pt is in the 90 day window to not retest.

## 2019-12-04 ENCOUNTER — Ambulatory Visit (INDEPENDENT_AMBULATORY_CARE_PROVIDER_SITE_OTHER): Payer: 59 | Admitting: *Deleted

## 2019-12-04 ENCOUNTER — Other Ambulatory Visit: Payer: Self-pay

## 2019-12-04 ENCOUNTER — Other Ambulatory Visit (HOSPITAL_COMMUNITY)
Admission: RE | Admit: 2019-12-04 | Discharge: 2019-12-04 | Disposition: A | Discharge status: Home / Self Care | Payer: 59 | Source: Ambulatory Visit | Attending: Family Medicine | Admitting: Family Medicine

## 2019-12-04 VITALS — BP 127/83 | HR 92

## 2019-12-04 DIAGNOSIS — O0990 Supervision of high risk pregnancy, unspecified, unspecified trimester: Secondary | ICD-10-CM

## 2019-12-04 NOTE — Progress Notes (Signed)
Subjective:  Jacqueline Abbott is a 34 y.o. female here for BP check.  Denies any issues at this time.     Objective:  BP 127/83   Pulse 92   LMP 03/08/2019   Appearance alert, well appearing, and in no distress. General exam BP noted to be well controlled today in office.    Assessment:   Blood Pressure well controlled.   Plan:  Current treatment plan is effective, no change in therapy.Marland Kitchen

## 2019-12-05 NOTE — Progress Notes (Signed)
Patient seen and assessed by nursing staff during this encounter. I have reviewed the chart and agree with the documentation and plan.  [place] Bing, MD 12/05/2019 2:41 PM

## 2019-12-06 ENCOUNTER — Other Ambulatory Visit: Payer: Self-pay

## 2019-12-06 ENCOUNTER — Inpatient Hospital Stay (HOSPITAL_COMMUNITY): Payer: 59

## 2019-12-06 ENCOUNTER — Inpatient Hospital Stay (HOSPITAL_COMMUNITY): Payer: 59 | Admitting: Anesthesiology

## 2019-12-06 ENCOUNTER — Inpatient Hospital Stay (HOSPITAL_COMMUNITY)
Admission: AD | Admit: 2019-12-06 | Discharge: 2019-12-08 | DRG: 807 | Disposition: A | Discharge status: Home / Self Care | Payer: 59 | Attending: Obstetrics and Gynecology | Admitting: Obstetrics and Gynecology

## 2019-12-06 ENCOUNTER — Encounter (HOSPITAL_COMMUNITY): Payer: Self-pay | Admitting: Family Medicine

## 2019-12-06 DIAGNOSIS — Z87891 Personal history of nicotine dependence: Secondary | ICD-10-CM

## 2019-12-06 DIAGNOSIS — Z3A39 39 weeks gestation of pregnancy: Secondary | ICD-10-CM

## 2019-12-06 DIAGNOSIS — O134 Gestational [pregnancy-induced] hypertension without significant proteinuria, complicating childbirth: Secondary | ICD-10-CM | POA: Diagnosis present

## 2019-12-06 DIAGNOSIS — Z6841 Body Mass Index (BMI) 40.0 and over, adult: Secondary | ICD-10-CM

## 2019-12-06 DIAGNOSIS — F419 Anxiety disorder, unspecified: Secondary | ICD-10-CM | POA: Diagnosis present

## 2019-12-06 DIAGNOSIS — O26893 Other specified pregnancy related conditions, third trimester: Secondary | ICD-10-CM | POA: Diagnosis present

## 2019-12-06 DIAGNOSIS — L732 Hidradenitis suppurativa: Secondary | ICD-10-CM | POA: Diagnosis present

## 2019-12-06 DIAGNOSIS — O9972 Diseases of the skin and subcutaneous tissue complicating childbirth: Secondary | ICD-10-CM | POA: Diagnosis present

## 2019-12-06 DIAGNOSIS — O98513 Other viral diseases complicating pregnancy, third trimester: Secondary | ICD-10-CM

## 2019-12-06 DIAGNOSIS — Z8616 Personal history of COVID-19: Secondary | ICD-10-CM | POA: Diagnosis not present

## 2019-12-06 DIAGNOSIS — O99210 Obesity complicating pregnancy, unspecified trimester: Secondary | ICD-10-CM | POA: Diagnosis present

## 2019-12-06 DIAGNOSIS — O139 Gestational [pregnancy-induced] hypertension without significant proteinuria, unspecified trimester: Secondary | ICD-10-CM | POA: Diagnosis present

## 2019-12-06 DIAGNOSIS — O99344 Other mental disorders complicating childbirth: Secondary | ICD-10-CM | POA: Diagnosis present

## 2019-12-06 DIAGNOSIS — O133 Gestational [pregnancy-induced] hypertension without significant proteinuria, third trimester: Secondary | ICD-10-CM

## 2019-12-06 DIAGNOSIS — O99214 Obesity complicating childbirth: Secondary | ICD-10-CM | POA: Diagnosis present

## 2019-12-06 DIAGNOSIS — O0990 Supervision of high risk pregnancy, unspecified, unspecified trimester: Secondary | ICD-10-CM

## 2019-12-06 DIAGNOSIS — O99333 Smoking (tobacco) complicating pregnancy, third trimester: Secondary | ICD-10-CM

## 2019-12-06 DIAGNOSIS — U071 COVID-19: Secondary | ICD-10-CM

## 2019-12-06 DIAGNOSIS — Z720 Tobacco use: Secondary | ICD-10-CM | POA: Diagnosis present

## 2019-12-06 LAB — COMPREHENSIVE METABOLIC PANEL
ALT: 15 U/L (ref 0–44)
AST: 29 U/L (ref 15–41)
Albumin: 2.4 g/dL — ABNORMAL LOW (ref 3.5–5.0)
Alkaline Phosphatase: 190 U/L — ABNORMAL HIGH (ref 38–126)
Anion gap: 10 (ref 5–15)
BUN: 8 mg/dL (ref 6–20)
CO2: 21 mmol/L — ABNORMAL LOW (ref 22–32)
Calcium: 8.7 mg/dL — ABNORMAL LOW (ref 8.9–10.3)
Chloride: 103 mmol/L (ref 98–111)
Creatinine, Ser: 0.63 mg/dL (ref 0.44–1.00)
GFR calc Af Amer: >60 mL/min (ref >60)
GFR calc non Af Amer: >60 mL/min (ref >60)
Glucose, Bld: 102 mg/dL — ABNORMAL HIGH (ref 70–99)
Potassium: 3.9 mmol/L (ref 3.5–5.1)
Sodium: 134 mmol/L — ABNORMAL LOW (ref 135–145)
Total Bilirubin: 0.6 mg/dL (ref 0.3–1.2)
Total Protein: 6.2 g/dL — ABNORMAL LOW (ref 6.5–8.1)

## 2019-12-06 LAB — CBC
HCT: 37 % (ref 36.0–46.0)
Hemoglobin: 12.3 g/dL (ref 12.0–15.0)
MCH: 31.4 pg (ref 26.0–34.0)
MCHC: 33.2 g/dL (ref 30.0–36.0)
MCV: 94.4 fL (ref 80.0–100.0)
Platelets: 318 10*3/uL (ref 150–400)
RBC: 3.92 MIL/uL (ref 3.87–5.11)
RDW: 13.9 % (ref 11.5–15.5)
WBC: 9.1 10*3/uL (ref 4.0–10.5)
nRBC: 0 % (ref 0.0–0.2)

## 2019-12-06 LAB — RPR: RPR Ser Ql: NONREACTIVE

## 2019-12-06 LAB — TYPE AND SCREEN
ABO/RH(D): O POS
Antibody Screen: NEGATIVE

## 2019-12-06 LAB — ABO/RH: ABO/RH(D): O POS

## 2019-12-06 LAB — PROTEIN / CREATININE RATIO, URINE
Creatinine, Urine: 227.77 mg/dL
Protein Creatinine Ratio: 0.1 mg/mg{Cre} (ref 0.00–0.15)
Total Protein, Urine: 22 mg/dL

## 2019-12-06 MED ORDER — PHENYLEPHRINE 40 MCG/ML (10ML) SYRINGE FOR IV PUSH (FOR BLOOD PRESSURE SUPPORT): 80.0000 ug | PREFILLED_SYRINGE | INTRAVENOUS | Status: DC | PRN

## 2019-12-06 MED ORDER — ACETAMINOPHEN 325 MG PO TABS: 650.0000 mg | ORAL_TABLET | ORAL | Status: DC | PRN

## 2019-12-06 MED ORDER — TERBUTALINE SULFATE 1 MG/ML IJ SOLN: 0.2500 mg | Freq: Once | INTRAMUSCULAR | Status: DC | PRN

## 2019-12-06 MED ORDER — OXYTOCIN 40 UNITS IN NORMAL SALINE INFUSION - SIMPLE MED
1.0000 m[IU]/min | INTRAVENOUS | Status: DC
  Administered 2019-12-06: 2 m[IU]/min via INTRAVENOUS

## 2019-12-06 MED ORDER — OXYTOCIN 40 UNITS IN NORMAL SALINE INFUSION - SIMPLE MED
2.5000 [IU]/h | INTRAVENOUS | Status: DC
  Filled 2019-12-06: qty 1000

## 2019-12-06 MED ORDER — LIDOCAINE HCL (PF) 1 % IJ SOLN
INTRAMUSCULAR | Status: DC | PRN
  Administered 2019-12-06: 10 mL via EPIDURAL

## 2019-12-06 MED ORDER — CITALOPRAM HYDROBROMIDE 20 MG PO TABS
20.0000 mg | ORAL_TABLET | Freq: Every day | ORAL | Status: DC
  Administered 2019-12-06: 20 mg via ORAL
  Filled 2019-12-06: qty 1

## 2019-12-06 MED ORDER — SODIUM CHLORIDE (PF) 0.9 % IJ SOLN
INTRAMUSCULAR | Status: DC | PRN
  Administered 2019-12-06: 12 mL/h via EPIDURAL

## 2019-12-06 MED ORDER — LACTATED RINGERS IV SOLN: 500.0000 mL | INTRAVENOUS | Status: DC | PRN

## 2019-12-06 MED ORDER — EPHEDRINE 5 MG/ML INJ: 10.0000 mg | INTRAVENOUS | Status: DC | PRN

## 2019-12-06 MED ORDER — SOD CITRATE-CITRIC ACID 500-334 MG/5ML PO SOLN: 30.0000 mL | ORAL | Status: DC | PRN

## 2019-12-06 MED ORDER — MISOPROSTOL 25 MCG QUARTER TABLET
25.0000 ug | ORAL_TABLET | ORAL | Status: DC | PRN
  Administered 2019-12-06: 25 ug via VAGINAL
  Filled 2019-12-06: qty 1

## 2019-12-06 MED ORDER — LACTATED RINGERS IV SOLN
500.0000 mL | Freq: Once | INTRAVENOUS | Status: AC
  Administered 2019-12-06: 500 mL via INTRAVENOUS

## 2019-12-06 MED ORDER — DIPHENHYDRAMINE HCL 50 MG/ML IJ SOLN: 12.5000 mg | INTRAMUSCULAR | Status: DC | PRN

## 2019-12-06 MED ORDER — LIDOCAINE HCL (PF) 1 % IJ SOLN: 30.0000 mL | INTRAMUSCULAR | Status: DC | PRN

## 2019-12-06 MED ORDER — LACTATED RINGERS IV SOLN: INTRAVENOUS | Status: DC

## 2019-12-06 MED ORDER — ONDANSETRON HCL 4 MG/2ML IJ SOLN
4.0000 mg | Freq: Four times a day (QID) | INTRAMUSCULAR | Status: DC | PRN
  Administered 2019-12-06 – 2019-12-07 (×2): 4 mg via INTRAVENOUS
  Filled 2019-12-06 (×2): qty 2

## 2019-12-06 MED ORDER — FENTANYL-BUPIVACAINE-NACL 0.5-0.125-0.9 MG/250ML-% EP SOLN
12.0000 mL/h | EPIDURAL | Status: DC | PRN
  Filled 2019-12-06: qty 250

## 2019-12-06 MED ORDER — OXYTOCIN BOLUS FROM INFUSION
500.0000 mL | Freq: Once | INTRAVENOUS | Status: AC
  Administered 2019-12-07: 500 mL via INTRAVENOUS

## 2019-12-06 NOTE — Anesthesia Preprocedure Evaluation (Signed)
Anesthesia Evaluation  Patient identified by MRN, date of birth, ID band Patient awake    Reviewed: Allergy & Precautions, H&P , NPO status , Patient's Chart, lab work & pertinent test results  History of Anesthesia Complications Negative for: history of anesthetic complications  Airway Mallampati: II  TM Distance: >3 FB Neck ROM: full    Dental no notable dental hx.    Pulmonary neg pulmonary ROS, former smoker,    Pulmonary exam normal        Cardiovascular negative cardio ROS Normal cardiovascular exam Rhythm:regular Rate:Normal     Neuro/Psych PSYCHIATRIC DISORDERS Anxiety negative neurological ROS     GI/Hepatic Neg liver ROS, GERD  ,  Endo/Other  Morbid obesity  Renal/GU      Musculoskeletal   Abdominal   Peds  Hematology negative hematology ROS (+)   Anesthesia Other Findings COVID-19 infection Jan 2021  Reproductive/Obstetrics (+) Pregnancy                             Anesthesia Physical Anesthesia Plan  ASA: III  Anesthesia Plan: Epidural   Post-op Pain Management:    Induction:   PONV Risk Score and Plan:   Airway Management Planned:   Additional Equipment:   Intra-op Plan:   Post-operative Plan:   Informed Consent: I have reviewed the patients History and Physical, chart, labs and discussed the procedure including the risks, benefits and alternatives for the proposed anesthesia with the patient or authorized representative who has indicated his/her understanding and acceptance.       Plan Discussed with:   Anesthesia Plan Comments:         Anesthesia Quick Evaluation

## 2019-12-06 NOTE — Progress Notes (Signed)
Patient breathing through contractions, considering epidural. Request AROM.   Vitals:   12/06/19 1301 12/06/19 1331 12/06/19 1431 12/06/19 1501  BP: (!) 143/81 (!) 150/83 (!) 142/77 (!) 146/79  Pulse: 87 92 85 98  Resp: 18 16 16 16   Temp:    98.9 F (37.2 C)  TempSrc:    Oral  Weight:      Height:        AROM@ 1545, clear fluid, moderate amount  Cervical examination unchanged   Dilation: 4.5 Effacement (%): 70 Station: -2 Presentation: Vertex Exam by:: Leiam Hopwood  Continue pitocin titration  Plans SVD    [phone], CNM 12/06/19, 3:53 PM

## 2019-12-06 NOTE — Anesthesia Procedure Notes (Signed)
Epidural Patient location during procedure: OB Start time: 12/06/2019 4:20 PM End time: 12/06/2019 4:33 PM  Staffing Anesthesiologist: Lucretia Kern, MD Performed: anesthesiologist   Preanesthetic Checklist Completed: patient identified, IV checked, risks and benefits discussed, monitors and equipment checked, pre-op evaluation and timeout performed  Epidural Patient position: sitting Prep: DuraPrep Patient monitoring: heart rate, continuous pulse ox and blood pressure Approach: midline Location: L3-L4 Injection technique: LOR air  Needle:  Needle type: Tuohy  Needle gauge: 17 G Needle length: 9 cm Needle insertion depth: 8 cm Catheter type: closed end flexible Catheter size: 19 Gauge Catheter at skin depth: 14 cm Test dose: negative  Assessment Events: blood not aspirated, injection not painful, no injection resistance, no paresthesia and negative IV test  Additional Notes Reason for block:procedure for pain

## 2019-12-06 NOTE — Progress Notes (Signed)
During a brief interview, the patient reports that neither she or her family members have a history of problems with anesthesia, including MH. Her airway is a MP 2. Patient reports no pertinent medical history or problems with this pregnancy. Any patient question and/or concerns were addressed.  She was informed that the anesthesia team is available to her 24/7 if she needs us or has further questions.  

## 2019-12-06 NOTE — Progress Notes (Signed)
Patient ID: NORMALEE SISTARE, female   DOB: November 05, 1985, 34 y.o.   MRN: [id number]  Mostly comfortable with epidural, but feeling a slight bit of vag pressure; bloody urine in foley bag; given 1L fluid bolus with some increase in urine  BP 152/79, 144/79, P 100s FHR 130s, +accels, +LTV, some early variables Ctx q 3 mins with Pit at 43mu/min Cx C/C/vtx -1/0  IUP@39 .0wks gHTN End 1st stage  Will rotate side-to-side, lat Sims, with peanut to try to facilitate fetal rotation and descent Recheck in 1hr or sooner with strong urge to push  [name] CNM 12/06/2019 10:13 PM

## 2019-12-06 NOTE — H&P (Signed)
LABOR AND DELIVERY ADMISSION HISTORY AND PHYSICAL NOTE  Jacqueline Abbott is a 34 y.o. female G2P1001 with IUP at [redacted]w[redacted]d by LMP presenting for elective IOL.  She reports positive fetal movement. She denies leakage of fluid or vaginal bleeding.  Prenatal History/Complications: PNC at [place] Pregnancy complications:  - Obesity in pregnancy  - Tobacco abuse  - COVID19 affective pregnancy in third trimester  - Anxiety, on celexa   Past Medical History: Past Medical History:  Diagnosis Date  . Anxiety   . Hidradenitis   . Left ovarian cyst   . Obesity     Past Surgical History: Past Surgical History:  Procedure Laterality Date  . CHOLECYSTECTOMY, LAPAROSCOPIC  2011  . DILATION AND CURETTAGE OF UTERUS  09/2013  . HYSTEROSCOPY  09/2013    Obstetrical History: OB History    Gravida  2   Para  1   Term  1   Preterm      AB      Living  1     SAB      TAB      Ectopic      Multiple      Live Births  1           Social History: Social History   Socioeconomic History  . Marital status: Married    Spouse name: Not on file  . Number of children: Not on file  . Years of education: Not on file  . Highest education level: Not on file  Occupational History  . Not on file  Tobacco Use  . Smoking status: Former Smoker    Types: Cigarettes    Quit date: 11/06/2019    Years since quitting: 0.0  . Smokeless tobacco: Never Used  Substance and Sexual Activity  . Alcohol use: No  . Drug use: No  . Sexual activity: Yes    Birth control/protection: None  Other Topics Concern  . Not on file  Social History Narrative  . Not on file   Social Determinants of Health   Financial Resource Strain:   . Difficulty of Paying Living Expenses: Not on file  Food Insecurity:   . Worried About Charity fundraiser in the Last Year: Not on file  . Ran Out of Food in the Last Year: Not on file  Transportation Needs:   . Lack of Transportation (Medical): Not on file  .  Lack of Transportation (Non-Medical): Not on file  Physical Activity:   . Days of Exercise per Week: Not on file  . Minutes of Exercise per Session: Not on file  Stress:   . Feeling of Stress : Not on file  Social Connections:   . Frequency of Communication with Friends and Family: Not on file  . Frequency of Social Gatherings with Friends and Family: Not on file  . Attends Religious Services: Not on file  . Active Member of Clubs or Organizations: Not on file  . Attends Archivist Meetings: Not on file  . Marital Status: Not on file    Family History: Family History  Problem Relation Age of Onset  . Hypertension Maternal Grandmother   . Heart disease Maternal Grandmother   . Arthritis Father     Allergies: Allergies  Allergen Reactions  . Shellfish Allergy Swelling    Medications Prior to Admission  Medication Sig Dispense Refill Last Dose  . citalopram (CELEXA) 20 MG tablet Take 1 tablet (20 mg total) by mouth daily. 90 tablet  3 12/05/2019 at Unknown time  . metoCLOPramide (REGLAN) 10 MG tablet Take 1 tablet (10 mg total) by mouth 4 (four) times daily as needed for nausea or vomiting. 30 tablet 2 Past Week at Unknown time     Review of Systems  All systems reviewed and negative except as stated in HPI  Physical Exam Blood pressure 123/80, pulse (!) 106, temperature 98.4 F (36.9 C), temperature source Oral, resp. rate 20, height 5\' 6"  (1.676 m), weight (!) 139.3 kg, last menstrual period 03/08/2019. General appearance: alert, cooperative and no distress Lungs: clear to auscultation bilaterally Heart: regular rate and rhythm Abdomen: soft, non-tender; bowel sounds normal Extremities: No calf swelling or tenderness Presentation: cephalic Fetal monitoring: 135/moderate/+accels/ no decelerations  Uterine activity: irregular mild contractions  Dilation: 1.5 Effacement (%): 60 Station: -2 Exam by:: Foley,rn  Prenatal labs: ABO, Rh: --/--/O POS (03/03  0746) Antibody: NEG (03/03 0746) Rubella: 7.20 (08/04 0940) RPR: Non Reactive (12/17 0844)  HBsAg: Negative (08/04 0940)  HIV: Non Reactive (12/17 0844)  GC/Chlamydia: negative GBS: --09[birth year]1982 (02/11 1101)  1 hr Glucola: failed, but passed 2 hr GTT (78-131-128) Genetic screening:  Negative  Anatomy 01-13-1984: normal female   [place] Location  [facility] Dating  Lmp=1st trimester u/s  Language  ENGLISH Anatomy Licensed conveyancer  neg  Flu Vaccine  Declined Genetic Screen  NIPS: low risk    TDaP vaccine   09/21/2019 Hgb A1C or  GTT Early  Third trimester [ ]  needs rpt: neg  Rhogam     LAB RESULTS   Feeding Plan formula Blood Type O/Positive/-- (08/04 0940)   Contraception OCP Antibody  neg  Circumcision yes Rubella 7.20 (08/04 0940)  Pediatrician   Peds RPR Non Reactive (08/04 0940)   Support Person Husband03-23-2006 HBsAg Negative (08/04 0940)   Prenatal Classes n/a HIV Non Reactive (08/04 0940)  BTL Consent  GBS  Negative  VBAC Consent  Pap  neg 2020    Hgb Electro   neg  BP Cuff Yes  CF     SMA     Waterbirth  [ ]  Class [ ]  Consent [ ]  CNM visit   Prenatal Transfer Tool  Maternal Diabetes: No Genetic Screening: Normal Maternal Ultrasounds/Referrals: Normal Fetal Ultrasounds or other Referrals:  None Maternal Substance Abuse:  Yes:  Type: Smoker Significant Maternal Medications:  Meds include: Other: Celexa Significant Maternal Lab Results: Group B Strep negative  Results for orders placed or performed during the hospital encounter of 12/06/19 (from the past 24 hour(s))  CBC   Collection Time: 12/06/19  7:34 AM  Result Value Ref Range   WBC 9.1 4.0 - 10.5 K/uL   RBC 3.92 3.87 - 5.11 MIL/uL   Hemoglobin 12.3 12.0 - 15.0 g/dL   HCT  - %   MCV 94.4 80.0 - 100.0 fL   MCH 31.4 26.0 - 34.0 pg   MCHC 33.2 30.0 - 36.0 g/dL   RDW 02/05/20 02/05/20 - 16.5 %   Platelets 318 150 - 400 K/uL   nRBC 0.0 0.0 - 0.2 %  Type and screen   Collection Time: 12/06/19  7:46  AM  Result Value Ref Range   ABO/RH(D) O POS    Antibody Screen NEG    Sample Expiration      12/09/2019,2359 Performed at [facility], [address], [place], 02/05/20 02/08/2020     Patient Active Problem List   Diagnosis Date Noted  . Labor and  delivery, indication for care 12/06/2019  . COVID-19 affecting pregnancy in third trimester 10/18/2019  . BMI 45.0-49.9, adult (HCC) 09/21/2019  . Tobacco abuse 09/21/2019  . Abnormal glucose affecting pregnancy 09/21/2019  . Supervision of high risk pregnancy, antepartum 09/21/2019  . Nausea and vomiting during pregnancy prior to [redacted] weeks gestation 06/22/2019  . Obesity in pregnancy 04/21/2016  . GERD (gastroesophageal reflux disease) 01/29/2015  . Anxiety 01/29/2015  . Hypercholesteremia 01/29/2015  . Hidradenitis axillaris 01/29/2015    Assessment: CHERYLANNE ARDELEAN is a 34 y.o. G2P1001 at [redacted]w[redacted]d here for elective IOL   #Labor: FB placed @0930 , cytotec x1. Continue cytotec until FB out then will move to pitocin  #Pain: Patient plans little to none pain medication  #FWB: Cat I  #ID:  GBS neg  #MOF: Formula  #MOC:OCP ([facility]) #Circ:  Yes, inpatient   , CNM 12/06/2019, 10:46 AM

## 2019-12-06 NOTE — Progress Notes (Signed)
Patient ID: [name], female   DOB: June 06, 1986, 34 y.o.   MRN: [id number]  Comfortable w/ epidural; Pit started at 1300 with AROM at 1545  BPs 154/88, 141/65 FHR 130s, +accels, occ mi variables Ctx q 2-3 mins with Pit @ 36mu/min Cx deferred (was 5/70/vtx -2 at RN exam 1720)  IUP@39 .0wks Initially here for elective IOL, then found to have gHTN Latent labor  Plan to check cx in a couple hours Keep Pit titrate for reg ctx Anticipate vag del  Jacqueline Abbott [facility] 12/06/2019 7:20 PM

## 2019-12-06 NOTE — Progress Notes (Signed)
LABOR PROGRESS NOTE  Jacqueline Abbott is a 34 y.o. G2P1001 at [redacted]w[redacted]d  admitted for elective IOL   Subjective: Patient doing well, breathing through contractions- reports occasional cramping mixed with contractions, rates pain 6/10.  Objective: BP (!) 146/79   Pulse 98   Temp 98.9 F (37.2 C) (Oral)   Resp 16   Ht 5\' 6"  (1.676 m)   Wt (!) 139.3 kg   LMP 03/08/2019   BMI 49.55 kg/m  or  Vitals:   12/06/19 1301 12/06/19 1331 12/06/19 1431 12/06/19 1501  BP: (!) 143/81 (!) 150/83 (!) 142/77 (!) 146/79  Pulse: 87 92 85 98  Resp: 18 16 16 16   Temp:    98.9 F (37.2 C)  TempSrc:    Oral  Weight:      Height:        Currently on 6 milli-unit/min of pitocin  Dilation: 4.5 Effacement (%): 60 Station: -2 Presentation: Vertex Exam by:: FHT: baseline rate 130, moderate varibility, +accel, no decel Toco: 2-3/ mild-moderate by palpation   Labs: Lab Results  Component Value Date   WBC 9.1 12/06/2019   HGB 12.3 12/06/2019   HCT 37.0 12/06/2019   MCV 94.4 12/06/2019   PLT 318 12/06/2019    Patient Active Problem List   Diagnosis Date Noted  . Labor and delivery, indication for care 12/06/2019  . COVID-19 affecting pregnancy in third trimester 10/18/2019  . BMI 45.0-49.9, adult (HCC) 09/21/2019  . Tobacco abuse 09/21/2019  . Abnormal glucose affecting pregnancy 09/21/2019  . Supervision of high risk pregnancy, antepartum 09/21/2019  . Nausea and vomiting during pregnancy prior to [redacted] weeks gestation 06/22/2019  . Obesity in pregnancy 04/21/2016  . GERD (gastroesophageal reflux disease) 01/29/2015  . Anxiety 01/29/2015  . Hypercholesteremia 01/29/2015  . Hidradenitis axillaris 01/29/2015    Assessment / Plan: 34 y.o. G2P1001 at [redacted]w[redacted]d here for elective IOL   Labor: Continue titrating pitocin until active labor, possible AROM at next cervical examination around 1700 Fetal Wellbeing:  Cat I  Pain Control:  Plans natural labor and delivery  Anticipated MOD:   SVD  Hypertension: GHTN diagnosed intrapartum, PEC labs negative, patient denies S/S of PEC.    34, CNM 12/06/2019, 3:20 PM

## 2019-12-07 ENCOUNTER — Encounter (HOSPITAL_COMMUNITY): Payer: Self-pay | Admitting: Family Medicine

## 2019-12-07 DIAGNOSIS — O139 Gestational [pregnancy-induced] hypertension without significant proteinuria, unspecified trimester: Secondary | ICD-10-CM | POA: Diagnosis present

## 2019-12-07 DIAGNOSIS — O99333 Smoking (tobacco) complicating pregnancy, third trimester: Secondary | ICD-10-CM

## 2019-12-07 DIAGNOSIS — O133 Gestational [pregnancy-induced] hypertension without significant proteinuria, third trimester: Secondary | ICD-10-CM

## 2019-12-07 DIAGNOSIS — Z3A39 39 weeks gestation of pregnancy: Secondary | ICD-10-CM

## 2019-12-07 MED ORDER — DIPHENHYDRAMINE HCL 25 MG PO CAPS: 25.0000 mg | ORAL_CAPSULE | Freq: Four times a day (QID) | ORAL | Status: DC | PRN

## 2019-12-07 MED ORDER — ACETAMINOPHEN 325 MG PO TABS: 650.0000 mg | ORAL_TABLET | ORAL | Status: DC | PRN

## 2019-12-07 MED ORDER — MEASLES, MUMPS & RUBELLA VAC IJ SOLR: 0.5000 mL | Freq: Once | INTRAMUSCULAR | Status: DC

## 2019-12-07 MED ORDER — IBUPROFEN 600 MG PO TABS
600.0000 mg | ORAL_TABLET | Freq: Four times a day (QID) | ORAL | Status: DC
  Administered 2019-12-07 – 2019-12-08 (×5): 600 mg via ORAL
  Filled 2019-12-07 (×4): qty 1

## 2019-12-07 MED ORDER — DIBUCAINE (PERIANAL) 1 % EX OINT: 1.0000 "application " | TOPICAL_OINTMENT | CUTANEOUS | Status: DC | PRN

## 2019-12-07 MED ORDER — OXYCODONE HCL 5 MG PO TABS: 5.0000 mg | ORAL_TABLET | ORAL | Status: DC | PRN

## 2019-12-07 MED ORDER — PRENATAL MULTIVITAMIN CH
1.0000 | ORAL_TABLET | Freq: Every day | ORAL | Status: DC
  Administered 2019-12-07: 1 via ORAL
  Filled 2019-12-07: qty 1

## 2019-12-07 MED ORDER — WITCH HAZEL-GLYCERIN EX PADS: 1.0000 "application " | MEDICATED_PAD | CUTANEOUS | Status: DC | PRN

## 2019-12-07 MED ORDER — SENNOSIDES-DOCUSATE SODIUM 8.6-50 MG PO TABS
2.0000 | ORAL_TABLET | ORAL | Status: DC
  Administered 2019-12-07: 2 via ORAL
  Filled 2019-12-07: qty 2

## 2019-12-07 MED ORDER — SIMETHICONE 80 MG PO CHEW: 80.0000 mg | CHEWABLE_TABLET | ORAL | Status: DC | PRN

## 2019-12-07 MED ORDER — ONDANSETRON HCL 4 MG PO TABS: 4.0000 mg | ORAL_TABLET | ORAL | Status: DC | PRN

## 2019-12-07 MED ORDER — TETANUS-DIPHTH-ACELL PERTUSSIS 5-2.5-18.5 LF-MCG/0.5 IM SUSP: 0.5000 mL | Freq: Once | INTRAMUSCULAR | Status: DC

## 2019-12-07 MED ORDER — BENZOCAINE-MENTHOL 20-0.5 % EX AERO: 1.0000 "application " | INHALATION_SPRAY | CUTANEOUS | Status: DC | PRN

## 2019-12-07 MED ORDER — ONDANSETRON HCL 4 MG/2ML IJ SOLN: 4.0000 mg | INTRAMUSCULAR | Status: DC | PRN

## 2019-12-07 MED ORDER — ZOLPIDEM TARTRATE 5 MG PO TABS: 5.0000 mg | ORAL_TABLET | Freq: Every evening | ORAL | Status: DC | PRN

## 2019-12-07 MED ORDER — COCONUT OIL OIL: 1.0000 "application " | TOPICAL_OIL | Status: DC | PRN

## 2019-12-07 NOTE — Anesthesia Postprocedure Evaluation (Signed)
Anesthesia Post Note  Patient: Jacqueline Abbott  Procedure(s) Performed: AN AD HOC LABOR EPIDURAL     Patient location during evaluation: Mother Baby Anesthesia Type: Epidural Level of consciousness: awake and alert Pain management: pain level controlled Vital Signs Assessment: post-procedure vital signs reviewed and stable Respiratory status: spontaneous breathing, nonlabored ventilation and respiratory function stable Cardiovascular status: stable Postop Assessment: no headache, no backache and epidural receding Anesthetic complications: no    Last Vitals:  Vitals:   12/07/19 0602 12/07/19 0631  BP: 134/67 115/68  Pulse: (!) 107 (!) 104  Resp: 17 18  Temp:    SpO2:  98%    Last Pain:  Vitals:   12/07/19 0715  TempSrc:   PainSc: Asleep   Pain Goal:                   [facility]

## 2019-12-07 NOTE — Progress Notes (Signed)
MOB was referred for history of depression/anxiety. * Referral screened out by Clinical Social Worker because none of the following criteria appear to apply: ~ History of anxiety/depression during this pregnancy, or of post-partum depression following prior delivery. ~ Diagnosis of anxiety and/or depression within last 3 years. Per further chart review, MOB was diagnosed with anxiety in 2016.  OR * MOB's symptoms currently being treated with medication and/or therapy. Per MOB's H&P. MOB On Celexa for Anxiety.     Please contact the Clinical Social Worker if needs arise, by MOB request, or if MOB scores greater than 9/yes to question 10 on Edinburgh Postpartum Depression Screen.    Ayme Short S. Ludell Zacarias, MSW, LCSW [facility] [place] [phone]  

## 2019-12-07 NOTE — Discharge Instructions (Signed)

## 2019-12-07 NOTE — Progress Notes (Signed)
Patient ID: Jacqueline Abbott, female   DOB: 1986-07-23, 34 y.o.   MRN: [id number]  Pushing about 1hr so far with good effort    BP 136/70, 146/88, P 116 FHR 125-130, +accels, occ variables, +LTV Ctx q 3-4 mins with Pit @ 67mu/min Vtx visible when labia spread, caput +2  Urine output minimal; foley has been flushed  IUP@39 .1wk gHTN- stable BPs End 1st stage- pushing  Continue pushing w/ ctx Anticipate vag del  [name] [facility] 12/07/2019

## 2019-12-07 NOTE — Discharge Summary (Signed)
Postpartum Discharge Summary     Patient Name: Jacqueline Abbott DOB: [birth year] MRN: [id number]  Date of admission: 12/06/2019 Delivering Provider: CONSTANT, PEGGY   Date of discharge: 12/08/2019  Admitting diagnosis: Labor and delivery, indication for care [O75.9] Intrauterine pregnancy: [redacted]w[redacted]d    Secondary diagnosis:  Active Problems:   Anxiety   Hidradenitis axillaris   Obesity in pregnancy   BMI 45.0-49.9, [place]   Tobacco abuse   Gestational hypertension without significant proteinuria  Additional problems: none     Discharge diagnosis: Term Pregnancy Delivered and Gestational Hypertension                                                                                                Post partum procedures:none  Augmentation: AROM, Pitocin, Cytotec and Foley Balloon  Complications: None  Hospital course:  Induction of Labor With Vaginal Delivery   34y.o. yo G2P1001 at 39w1das admitted to the hospital 12/06/2019 for induction of labor.  Indication for induction: elective at term.  Patient had a labor course remarkable for the development of gHTN as labor progressed; pre-e labs were negative and she denied s/s of pre-e. Her BPs remained moderately elevated but not in severe range. She pushed for approx 2.5hrs with good effort, and then requested assistance with the vacuum (see delivery note for details). Membrane Rupture Time/Date: 3:45 PM ,12/06/2019   Intrapartum Procedures: Episiotomy: None [1]                                         Lacerations:  1st degree [2];Labial [10]  Patient had delivery of a Viable infant.  Information for the patient's newborn:  Jacqueline AbbottDelivery Method: Vaginal, Vacuum (Extractor)(Filed from Delivery Summary)    12/07/2019  Details of delivery can be found in separate delivery note.  Patient had a routine postpartum course and her blood pressures were well controlled. Patient is discharged home 12/08/19. Delivery  time: 3:40 AM    Magnesium Sulfate received: No BMZ received: No Rhophylac:N/A MMR:N/A Transfusion:No  Physical exam  Vitals:   12/07/19 1428 12/07/19 1825 12/07/19 2206 12/08/19 0620  BP: (!) 119/59 127/66 125/65 126/69  Pulse: 93 (!) 102 (!) 110 84  Resp: 18 20  18   Temp: 98 F (36.7 C) 98.1 F (36.7 C) 97.7 F (36.5 C) 97.8 F (36.6 C)  TempSrc: Oral Oral Oral   SpO2:   99% 99%  Weight:      Height:       General: alert, cooperative and no distress Lochia: appropriate Uterine Fundus: firm Incision: N/A DVT Evaluation: No evidence of DVT seen on physical exam. No cords or calf tenderness. No significant calf/ankle edema. Labs: Lab Results  Component Value Date   WBC 9.1 12/06/2019   HGB 12.3 12/06/2019   HCT 37.0 12/06/2019   MCV 94.4 12/06/2019   PLT 318 12/06/2019   CMP Latest Ref Rng & Units 12/06/2019  Glucose 70 - 99 mg/dL 102(H)  BUN  6 - 20 mg/dL 8  Creatinine 0.44 - 1.00 mg/dL 0.63  Sodium 135 - 145 mmol/L 134(L)  Potassium 3.5 - 5.1 mmol/L 3.9  Chloride 98 - 111 mmol/L 103  CO2 22 - 32 mmol/L 21(L)  Calcium 8.9 - 10.3 mg/dL 8.7(L)  Total Protein 6.5 - 8.1 g/dL 6.2(L)  Total Bilirubin 0.3 - 1.2 mg/dL 0.6  Alkaline Phos 38 - 126 U/L 190(H)  AST 15 - 41 U/L 29  ALT 0 - 44 U/L 15   Edinburgh Score: Edinburgh Postnatal Depression Scale Screening Tool 12/07/2019  I have been able to laugh and see the funny side of things. 0  I have looked forward with enjoyment to things. 0  I have blamed myself unnecessarily when things went wrong. 2  I have been anxious or worried for no good reason. 2  I have felt scared or panicky for no good reason. 0  Things have been getting on top of me. 1  I have been so unhappy that I have had difficulty sleeping. 0  I have felt sad or miserable. 0  I have been so unhappy that I have been crying. 1  The thought of harming myself has occurred to me. 0  Edinburgh Postnatal Depression Scale Total 6    Discharge  instruction: per After Visit Summary and "Baby and Me Booklet".  After visit meds:  Allergies as of 12/08/2019      Reactions   Shellfish Allergy Swelling      Medication List    TAKE these medications   citalopram 20 MG tablet Commonly known as: CELEXA Take 1 tablet (20 mg total) by mouth daily.   ibuprofen 600 MG tablet Commonly known as: ADVIL Take 1 tablet (600 mg total) by mouth every 6 (six) hours.   metoCLOPramide 10 MG tablet Commonly known as: REGLAN Take 1 tablet (10 mg total) by mouth 4 (four) times daily as needed for nausea or vomiting.       Diet: routine diet  Activity: Advance as tolerated. Pelvic rest for 6 weeks.   Outpatient follow up:BP check in 1wk; PP visit in 4 weeks Follow up Appt:No future appointments. Follow up Visit:  Please schedule this patient for Postpartum visit in: 4 weeks with the following provider: Any provider Virtual For C/S patients schedule nurse incision check in weeks 2 weeks: no High risk pregnancy complicated by: gHTN during labor Delivery mode:  Vacuum Anticipated Birth Control:  OCPs PP Procedures needed: BP check- 1 week (can be virtual if pt wants) Schedule Integrated BH visit: no   Newborn Data: Live born female  Birth Weight: 3595gm (7lb 14.8oz)  APGAR: 7, 9  Newborn Delivery   Birth date/time: 12/07/2019 03:40:00 Delivery type: Vaginal, Vacuum (Extractor)      Baby Feeding: Bottle Disposition:home with mother   12/08/2019 [name], MD  GME ATTESTATION:  I saw and evaluated the patient. I agree with the findings and the plan of care as documented in the resident's note.  Merilyn Baba, DO OB Fellow, [place] [facility] [place] 12/08/2019 8:00 AM

## 2019-12-08 MED ORDER — IBUPROFEN 600 MG PO TABS: 600.0000 mg | ORAL_TABLET | Freq: Four times a day (QID) | ORAL | 0 refills | Status: DC

## 2019-12-12 ENCOUNTER — Telehealth: Payer: Self-pay | Admitting: Radiology

## 2019-12-12 NOTE — Telephone Encounter (Signed)
Left message for patient to call [facility]. Missed BP check appointment.

## 2019-12-13 ENCOUNTER — Other Ambulatory Visit: Payer: Self-pay

## 2019-12-13 ENCOUNTER — Ambulatory Visit (INDEPENDENT_AMBULATORY_CARE_PROVIDER_SITE_OTHER): Payer: 59 | Admitting: *Deleted

## 2019-12-13 DIAGNOSIS — O139 Gestational [pregnancy-induced] hypertension without significant proteinuria, unspecified trimester: Secondary | ICD-10-CM

## 2019-12-13 DIAGNOSIS — Z0131 Encounter for examination of blood pressure with abnormal findings: Secondary | ICD-10-CM

## 2019-12-13 NOTE — Progress Notes (Signed)
Pt called in for her BP check via telephone. Pt took her BP this am and it was 129/93, asked pt to retake while I was on the phone and it was 144/89. Pt denies any HA, vision changes, SOB. Told pt I would discuss with provider and call her back if we needed to recheck BP or change anything with POC. Pt verbalizes and understands.   Spoke with Dr Alvester Morin, to have pt come in office to recheck BP.

## 2019-12-13 NOTE — Progress Notes (Signed)
Attestation of Attending Supervision of clinical support staff: I agree with the care provided to this patient and was available for any consultation.  I have reviewed the RN's note and chart. I was available for consult and to see the patient if needed.   Eldean Nanna Niles Samyiah Halvorsen, MD, MPH, ABFM Attending Physician Faculty Practice- [facility]  

## 2019-12-15 ENCOUNTER — Ambulatory Visit (INDEPENDENT_AMBULATORY_CARE_PROVIDER_SITE_OTHER): Payer: 59 | Admitting: *Deleted

## 2019-12-15 ENCOUNTER — Other Ambulatory Visit: Payer: Self-pay

## 2019-12-15 VITALS — BP 128/85 | HR 89

## 2019-12-15 DIAGNOSIS — O139 Gestational [pregnancy-induced] hypertension without significant proteinuria, unspecified trimester: Secondary | ICD-10-CM

## 2019-12-15 DIAGNOSIS — Z0130 Encounter for examination of blood pressure without abnormal findings: Secondary | ICD-10-CM

## 2019-12-15 NOTE — Progress Notes (Signed)
Subjective:  Jacqueline Abbott is a 34 y.o. female here for BP check.   Hypertension ROS: Denies any HA, vision changes, SOB and swelling.   Objective:  BP 128/85   Pulse 89   Appearance alert, well appearing, and in no distress. General exam BP noted to be well controlled today in office.    Assessment:   Blood Pressure well controlled.   Plan:  Follow up: 4 weeks and as needed.Marland Kitchen

## 2019-12-15 NOTE — Progress Notes (Signed)
Attestation of Attending Supervision of clinical support staff: I agree with the care provided to this patient and was available for any consultation.  I have reviewed the RN's note and chart. I was available for consult and to see the patient if needed.   Nevaeha Finerty Niles Susannah Carbin, MD, MPH, ABFM Attending Physician Faculty Practice- [facility]  

## 2020-01-04 ENCOUNTER — Ambulatory Visit (INDEPENDENT_AMBULATORY_CARE_PROVIDER_SITE_OTHER): Payer: 59 | Admitting: Obstetrics & Gynecology

## 2020-01-04 ENCOUNTER — Other Ambulatory Visit: Payer: Self-pay

## 2020-01-04 ENCOUNTER — Encounter: Payer: Self-pay | Admitting: Obstetrics & Gynecology

## 2020-01-04 DIAGNOSIS — Z716 Tobacco abuse counseling: Secondary | ICD-10-CM

## 2020-01-04 MED ORDER — NORGESTIMATE-ETH ESTRADIOL 0.25-35 MG-MCG PO TABS: 1.0000 | ORAL_TABLET | Freq: Every day | ORAL | 11 refills | Status: DC

## 2020-01-04 NOTE — Progress Notes (Signed)
    Post Partum Visit Note  Jacqueline Abbott is a 34 y.o. G55P2002 female who presents for a postpartum visit. She is 4 weeks postpartum following a normal spontaneous vaginal delivery.  I have fully reviewed the prenatal and intrapartum course. The delivery was at 39 gestational weeks, patient had gestational hypertension.  Anesthesia: Epidural. Postpartum course has been uncomplicated. Baby is doing well Baby is feeding by bottle. Bleeding none. Bowel function is normal. Bladder function is normal. Patient is not sexually active. Desired contraception method is birth control pill. Postpartum depression screening: negative.  The following portions of the patient's history were reviewed and updated as appropriate: allergies, current medications, past family history, past medical history, past social history, past surgical history and problem list.  Review of Systems Pertinent items noted in HPI and remainder of comprehensive ROS otherwise negative.    Objective:  Blood pressure 108/72, pulse 83, weight 283 lb (128.4 kg), unknown if currently breastfeeding.  General:  alert and no distress   Breasts:  deferred  Lungs: clear to auscultation bilaterally  Heart:  regular rate and rhythm  Abdomen: soft, non-tender; bowel sounds normal; no masses,  no organomegaly  Pelvic:  not evaluated        Assessment:   Normal postpartum exam.   Plan:   Postpartum care following vaginal delivery  Essential components of care per ACOG recommendations:  1.  Mood and well being: Patient with negative depression screening today.On Celexa for history of anxiety - Patient does use tobacco. If using tobacco we discussed reduction and for recently cessation risk of relapse. Referred to smoking cessation program. - hx of drug use? No    2. Infant care and feeding:  -Patient currently breastmilk feeding? No  -Social determinants of health (SDOH) reviewed in EPIC. No concerns  3. Sexuality, contraception  and birth spacing - Patient does not want a pregnancy in the next year.  Desired family size is 2 children.  - Reviewed forms of contraception in tiered fashion. Patient desired oral contraceptives (estrogen/progesterone) today. Sprintec prescribed, return in 2 months for BP check.  Offered LARCs, she declined for now. Trying to talk her husband into vasectomy.  - Discussed birth spacing of 18 months  4. Sleep and fatigue -Encouraged family/partner/community support of 4 hrs of uninterrupted sleep to help with mood and fatigue  5. Physical Recovery  - Discussed patients delivery - Patient had a 1st degree laceration, perineal healing reviewed. Patient expressed understanding - Patient has urinary incontinence? No - Patient is safe to resume physical and sexual activity  6.  Health Maintenance - Last pap smear done 10/26/2018 and was normal with negative HPV.   Jaynie Collins, MD [facility] [employer], [facility]

## 2020-01-04 NOTE — Patient Instructions (Signed)

## 2020-01-29 ENCOUNTER — Telehealth: Payer: Self-pay | Admitting: *Deleted

## 2020-01-29 NOTE — Telephone Encounter (Signed)
Called pt regarding her mychart message. Unsure why she is having prolonged bleeding and swelling. Pt should make an appoint,ent to be seen. Discussed bleeding precautions and if it gets worse to call office. Also recommended elevating her feet when possible and to wear compression socks, especially while working. Pt verbalizes and understands. Appt made for 5/6

## 2020-02-08 ENCOUNTER — Ambulatory Visit: Payer: 59 | Admitting: Obstetrics & Gynecology

## 2020-02-27 ENCOUNTER — Encounter: Payer: Self-pay | Admitting: Obstetrics & Gynecology

## 2020-02-27 ENCOUNTER — Other Ambulatory Visit: Payer: Self-pay

## 2020-02-27 ENCOUNTER — Telehealth (INDEPENDENT_AMBULATORY_CARE_PROVIDER_SITE_OTHER): Payer: 59 | Admitting: Obstetrics & Gynecology

## 2020-02-27 VITALS — BP 127/81 | HR 79

## 2020-02-27 DIAGNOSIS — Z3041 Encounter for surveillance of contraceptive pills: Secondary | ICD-10-CM

## 2020-02-27 MED ORDER — NORGESTIMATE-ETH ESTRADIOL 0.25-35 MG-MCG PO TABS: 1.0000 | ORAL_TABLET | Freq: Every day | ORAL | 5 refills | Status: DC

## 2020-02-27 NOTE — Progress Notes (Signed)
   TELEHEALTH GYNECOLOGY VISIT ENCOUNTER NOTE  I connected with Jacqueline Abbott on 02/27/20 at 10:00 AM EDT by telephone at home and verified that I am speaking with the correct person using two identifiers. She is unable to do video encounter.   I discussed the limitations, risks, security and privacy concerns of performing an evaluation and management service by telephone and the availability of in person appointments. I also discussed with the patient that there may be a patient responsible charge related to this service. The patient expressed understanding and agreed to proceed.   History:  Jacqueline Abbott is a 34 y.o. [phone] female being followed up today after initiation of OCPs for postpartum contraception. Denies any side effects. Tolerating it well. No mood changes.  She denies any abnormal vaginal discharge, bleeding, pelvic pain or other concerns.      Past Medical History:  Diagnosis Date  . Anxiety   . Hidradenitis   . Left ovarian cyst   . Obesity    Past Surgical History:  Procedure Laterality Date  . CHOLECYSTECTOMY, LAPAROSCOPIC  2011  . DILATION AND CURETTAGE OF UTERUS  09/2013  . HYSTEROSCOPY  09/2013   The following portions of the patient's history were reviewed and updated as appropriate: allergies, current medications, past family history, past medical history, past social history, past surgical history and problem list.   Health Maintenance:  Normal pap and negative HRHPV on 10/26/2018.    Review of Systems:  Pertinent items noted in HPI and remainder of comprehensive ROS otherwise negative.  Physical Exam:   General:  Alert, oriented and cooperative.   Mental Status: Normal mood and affect perceived. Normal judgment and thought content.  Physical exam deferred due to nature of the encounter     Assessment and Plan:     1. Oral contraceptive pill surveillance Doing well, no concerns. Refills sent to pharmacy. - norgestimate-ethinyl estradiol  (ORTHO-CYCLEN) 0.25-35 MG-MCG tablet; Take 1 tablet by mouth daily.  Dispense: 3 Package; Refill: 5     I discussed the assessment and treatment plan with the patient. The patient was provided an opportunity to ask questions and all were answered. The patient agreed with the plan and demonstrated an understanding of the instructions.   The patient was advised to call back or seek an in-person evaluation/go to the ED if the symptoms worsen or if the condition fails to improve as anticipated.  I provided 7 minutes of non-face-to-face time during this encounter.   Jaynie Collins, MD [facility] [employer], [facility]

## 2020-02-27 NOTE — Progress Notes (Signed)
I connected with  [name] on 02/27/20 at 10:00 AM EDT by telephone and verified that I am speaking with the correct person using two identifiers.   I discussed the limitations, risks, security and privacy concerns of performing an evaluation and management service by telephone and the availability of in person appointments. I also discussed with the patient that there may be a patient responsible charge related to this service. The patient expressed understanding and agreed to proceed.  Andrey Mccaskill Emeline Darling, CMA 02/27/2020  10:01 AM

## 2020-07-04 ENCOUNTER — Other Ambulatory Visit: Payer: Self-pay | Admitting: Obstetrics & Gynecology

## 2020-07-05 ENCOUNTER — Telehealth: Payer: Self-pay

## 2020-07-05 NOTE — Telephone Encounter (Signed)
Pt called in and stated that she [facility] [address] Please advise

## 2020-07-05 NOTE — Telephone Encounter (Signed)
This pt was seen here with in a year, the pt was requesting a refill from the last office that filled it for her. When I look at a patients appointment desk, please excuse me for not looking at another office that I don't work for. If we cant fill the medication for the patient, I feel like that should be a talk with the patient from the provider or the nurse.

## 2020-07-05 NOTE — Telephone Encounter (Signed)
Received telephone message from [place] stating patient needs a refill on Celexa. While looking at patients last appointment date and time, patient has been receiving care from another provider outside of [facility]. Message was rerouted to [place] to complete.

## 2020-07-09 ENCOUNTER — Telehealth: Payer: Self-pay

## 2020-07-09 NOTE — Telephone Encounter (Signed)
Patient called in stating that her pharmacy had changed to the [facility] in [place].

## 2020-07-09 NOTE — Telephone Encounter (Signed)
Pt called in to schedule an appt- 07/24/20- 11:30.

## 2020-07-09 NOTE — Telephone Encounter (Signed)
mychart message sent to patient- cannot refill Celexa per Dr Valentino Saxon.

## 2020-07-09 NOTE — Telephone Encounter (Signed)
mychart message sent to patient

## 2020-07-09 NOTE — Telephone Encounter (Signed)
Mychart message sent to patient.

## 2020-07-10 ENCOUNTER — Other Ambulatory Visit: Payer: Self-pay

## 2020-07-10 MED ORDER — CITALOPRAM HYDROBROMIDE 20 MG PO TABS: 20.0000 mg | ORAL_TABLET | Freq: Every day | ORAL | 0 refills | Status: DC

## 2020-07-17 ENCOUNTER — Encounter: Payer: Self-pay | Admitting: Certified Nurse Midwife

## 2020-07-17 ENCOUNTER — Other Ambulatory Visit: Payer: Self-pay

## 2020-07-17 ENCOUNTER — Ambulatory Visit (INDEPENDENT_AMBULATORY_CARE_PROVIDER_SITE_OTHER): Payer: 59 | Admitting: Certified Nurse Midwife

## 2020-07-17 ENCOUNTER — Other Ambulatory Visit (HOSPITAL_COMMUNITY)
Admission: RE | Admit: 2020-07-17 | Discharge: 2020-07-17 | Disposition: A | Discharge status: Home / Self Care | Payer: 59 | Source: Ambulatory Visit | Attending: Certified Nurse Midwife | Admitting: Certified Nurse Midwife

## 2020-07-17 VITALS — BP 123/69 | HR 87 | Ht 66.0 in | Wt 286.1 lb

## 2020-07-17 DIAGNOSIS — Z01419 Encounter for gynecological examination (general) (routine) without abnormal findings: Secondary | ICD-10-CM | POA: Diagnosis present

## 2020-07-17 DIAGNOSIS — N898 Other specified noninflammatory disorders of vagina: Secondary | ICD-10-CM

## 2020-07-17 DIAGNOSIS — Z3041 Encounter for surveillance of contraceptive pills: Secondary | ICD-10-CM | POA: Diagnosis not present

## 2020-07-17 MED ORDER — NORGESTIMATE-ETH ESTRADIOL 0.25-35 MG-MCG PO TABS: 1.0000 | ORAL_TABLET | Freq: Every day | ORAL | 11 refills | Status: DC

## 2020-07-17 NOTE — Progress Notes (Signed)
GYNECOLOGY ANNUAL PREVENTATIVE CARE ENCOUNTER NOTE  History:     Jacqueline Abbott is a 34 y.o. G55P2002 female here for a routine annual gynecologic exam.  Current complaints: flair ups with Hidradenitis. Has seen dermatology in the past..   Denies abnormal vaginal bleeding, discharge, pelvic pain, problems with intercourse or other gynecologic concerns.     Social Relationship: married Living:with husband and children Chief Strategy Officer Exercise:1-2 x week for 30 min Smoke/Alcohol/drug use: denies  Gynecologic History No LMP recorded. Contraception: OCP (estrogen/progesterone) Last Pap: 01/24/2018 Results were: normal with negative HPV Last mammogram: n/a.    Upstream - 07/17/20 1335      Pregnancy Intention Screening   Does the patient want to become pregnant in the next year? No    Does the patient's partner want to become pregnant in the next year? No    Would the patient like to discuss contraceptive options today? Yes      Contraception Wrap Up   Current Method Oral Contraceptive          The pregnancy intention screening data noted above was reviewed. Potential methods of contraception were discussed. The patient elected to proceed with Oral Contraceptive.  Obstetric History OB History  Gravida Para Term Preterm AB Living  2 2 2     2   SAB TAB Ectopic Multiple Live Births        0 2    # Outcome Date GA Lbr Len/2nd Weight Sex Delivery Anes PTL Lv  2 Term 12/07/19 [redacted]w[redacted]d 01:31 / 05:49 7 lb 14.8 oz (3.595 kg) M Vag-Vacuum EPI  LIV     Birth Comments: wnl  1 Term 2016 [redacted]w[redacted]d  8 lb (3.629 kg) F Vag-Spont EPI N LIV    Past Medical History:  Diagnosis Date  . Anxiety   . Hidradenitis   . Left ovarian cyst   . Obesity     Past Surgical History:  Procedure Laterality Date  . CHOLECYSTECTOMY, LAPAROSCOPIC  2011  . DILATION AND CURETTAGE OF UTERUS  09/2013  . HYSTEROSCOPY  09/2013    Current Outpatient Medications on File Prior to Visit  Medication Sig  Dispense Refill  . citalopram (CELEXA) 20 MG tablet Take 1 tablet (20 mg total) by mouth daily. 14 tablet 0  . norgestimate-ethinyl estradiol (ORTHO-CYCLEN) 0.25-35 MG-MCG tablet Take 1 tablet by mouth daily. 3 Package 5   No current facility-administered medications on file prior to visit.    Allergies  Allergen Reactions  . Shellfish Allergy Swelling    Social History:  reports that she quit smoking about 8 months ago. Her smoking use included cigarettes. She has never used smokeless tobacco. She reports that she does not drink alcohol and does not use drugs.  Family History  Problem Relation Age of Onset  . Hypertension Maternal Grandmother   . Heart disease Maternal Grandmother   . Arthritis Father     The following portions of the patient's history were reviewed and updated as appropriate: allergies, current medications, past family history, past medical history, past social history, past surgical history and problem list.  Review of Systems Pertinent items noted in HPI and remainder of comprehensive ROS otherwise negative.  Physical Exam:  BP 123/69   Pulse 87   Ht 5\' 6"  (1.676 m)   Wt 286 lb 2 oz (129.8 kg)   BMI 46.18 kg/m  CONSTITUTIONAL: Well-developed, over weight, well-nourished female in no acute distress.  HENT:  Normocephalic, atraumatic, External right and left ear  normal. Oropharynx is clear and moist EYES: Conjunctivae and EOM are normal. Pupils are equal, round, and reactive to light. No scleral icterus.  NECK: Normal range of motion, supple, no masses.  Normal thyroid.  SKIN: Skin is warm and dry. No rash noted. Not diaphoretic. No erythema. No pallor. MUSCULOSKELETAL: Normal range of motion. No tenderness.  No cyanosis, clubbing, or edema.  2+ distal pulses. NEUROLOGIC: Alert and oriented to person, place, and time. Normal reflexes, muscle tone coordination.  PSYCHIATRIC: Normal mood and affect. Normal behavior. Normal judgment and thought  content. CARDIOVASCULAR: Normal heart rate noted, regular rhythm RESPIRATORY: Clear to auscultation bilaterally. Effort and breath sounds normal, no problems with respiration noted. BREASTS: Symmetric in size. No masses, tenderness, skin changes, nipple drainage, or lymphadenopathy bilaterally. No hidradenitis noted on exam today  ABDOMEN: Soft, no distention noted.  No tenderness, rebound or guarding.  PELVIC: Normal appearing external genitalia and urethral meatus; normal appearing vaginal mucosa and cervix.  No abnormal discharge noted.  Pap smear  Not due.   Normal uterine size, no other palpable masses, no uterine or adnexal tenderness. Exam compromised due to body habitus .   Assessment and Plan:    1. Women's annual routine gynecological examination  Pap: not due until 2025 Mammogram :n/a Labs: declines  Refills:ocp Referral: none Pt state she experiences flair up around her cycle time . Discussed reaching out to her dermatologist to discussed antibiotic use or we could try different bc method. Reviewed methods. She will let me know if interested.  Routine preventative health maintenance measures emphasized. Please refer to After Visit Summary for other counseling recommendations.      Doreene Burke, CNM [facility],  [facility]

## 2020-07-17 NOTE — Addendum Note (Signed)
Addended by: Mechele Claude on: 07/17/2020 03:16 PM   Modules accepted: Orders

## 2020-07-17 NOTE — Patient Instructions (Signed)
Preventive Care 20-33 Years Old, Female Preventive care refers to visits with your health care provider and lifestyle choices that can promote health and wellness. This includes:  A yearly physical exam. This may also be called an annual well check.  Regular dental visits and eye exams.  Immunizations.  Screening for certain conditions.  Healthy lifestyle choices, such as eating a healthy diet, getting regular exercise, not using drugs or products that contain nicotine and tobacco, and limiting alcohol use. What can I expect for my preventive care visit? Physical exam Your health care provider will check your:  Height and weight. This may be used to calculate body mass index (BMI), which tells if you are at a healthy weight.  Heart rate and blood pressure.  Skin for abnormal spots. Counseling Your health care provider may ask you questions about your:  Alcohol, tobacco, and drug use.  Emotional well-being.  Home and relationship well-being.  Sexual activity.  Eating habits.  Work and work Statistician.  Method of birth control.  Menstrual cycle.  Pregnancy history. What immunizations do I need?  Influenza (flu) vaccine  This is recommended every year. Tetanus, diphtheria, and pertussis (Tdap) vaccine  You may need a Td booster every 10 years. Varicella (chickenpox) vaccine  You may need this if you have not been vaccinated. Human papillomavirus (HPV) vaccine  If recommended by your health care provider, you may need three doses over 6 months. Measles, mumps, and rubella (MMR) vaccine  You may need at least one dose of MMR. You may also need a second dose. Meningococcal conjugate (MenACWY) vaccine  One dose is recommended if you are age 75-21 years and a first-year college student living in a residence hall, or if you have one of several medical conditions. You may also need additional booster doses. Pneumococcal conjugate (PCV13) vaccine  You may need  this if you have certain conditions and were not previously vaccinated. Pneumococcal polysaccharide (PPSV23) vaccine  You may need one or two doses if you smoke cigarettes or if you have certain conditions. Hepatitis A vaccine  You may need this if you have certain conditions or if you travel or work in places where you may be exposed to hepatitis A. Hepatitis B vaccine  You may need this if you have certain conditions or if you travel or work in places where you may be exposed to hepatitis B. Haemophilus influenzae type b (Hib) vaccine  You may need this if you have certain conditions. You may receive vaccines as individual doses or as more than one vaccine together in one shot (combination vaccines). Talk with your health care provider about the risks and benefits of combination vaccines. What tests do I need?  Blood tests  Lipid and cholesterol levels. These may be checked every 5 years starting at age 33.  Hepatitis C test.  Hepatitis B test. Screening  Diabetes screening. This is done by checking your blood sugar (glucose) after you have not eaten for a while (fasting).  Sexually transmitted disease (STD) testing.  BRCA-related cancer screening. This may be done if you have a family history of breast, ovarian, tubal, or peritoneal cancers.  Pelvic exam and Pap test. This may be done every 3 years starting at age 76. Starting at age 102, this may be done every 5 years if you have a Pap test in combination with an HPV test. Talk with your health care provider about your test results, treatment options, and if necessary, the need for more tests.  Follow these instructions at home: Eating and drinking   Eat a diet that includes fresh fruits and vegetables, whole grains, lean protein, and low-fat dairy.  Take vitamin and mineral supplements as recommended by your health care provider.  Do not drink alcohol if: ? Your health care provider tells you not to drink. ? You are  pregnant, may be pregnant, or are planning to become pregnant.  If you drink alcohol: ? Limit how much you have to 0-1 drink a day. ? Be aware of how much alcohol is in your drink. In the [place] one drink equals one 12 oz bottle of beer (355 mL), one 5 oz glass of wine (148 mL), or one 1 oz glass of hard liquor (44 mL). Lifestyle  Take daily care of your teeth and gums.  Stay active. Exercise for at least 30 minutes on 5 or more days each week.  Do not use any products that contain nicotine or tobacco, such as cigarettes, e-cigarettes, and chewing tobacco. If you need help quitting, ask your health care provider.  If you are sexually active, practice safe sex. Use a condom or other form of birth control (contraception) in order to prevent pregnancy and STIs (sexually transmitted infections). If you plan to become pregnant, see your health care provider for a preconception visit. What's next?  Visit your health care provider once a year for a well check visit.  Ask your health care provider how often you should have your eyes and teeth checked.  Stay up to date on all vaccines. This information is not intended to replace advice given to you by your health care provider. Make sure you discuss any questions you have with your health care provider. Document Revised: 06/02/2018 Document Reviewed: 06/02/2018 Elsevier Patient Education  2020 [employer].

## 2020-07-19 LAB — CERVICOVAGINAL ANCILLARY ONLY
Bacterial Vaginitis (gardnerella): NEGATIVE
Candida Glabrata: NEGATIVE
Candida Vaginitis: NEGATIVE
Comment: NEGATIVE
Comment: NEGATIVE
Comment: NEGATIVE

## 2020-07-24 ENCOUNTER — Encounter: Payer: 59 | Admitting: Certified Nurse Midwife

## 2020-08-15 ENCOUNTER — Other Ambulatory Visit: Payer: Self-pay | Admitting: Certified Nurse Midwife

## 2021-05-05 ENCOUNTER — Other Ambulatory Visit: Payer: Self-pay | Admitting: Certified Nurse Midwife

## 2021-05-05 DIAGNOSIS — Z3041 Encounter for surveillance of contraceptive pills: Secondary | ICD-10-CM

## 2021-07-22 ENCOUNTER — Encounter: Payer: Self-pay | Admitting: Certified Nurse Midwife

## 2021-07-22 ENCOUNTER — Encounter: Payer: 59 | Admitting: Certified Nurse Midwife

## 2021-07-28 ENCOUNTER — Other Ambulatory Visit: Payer: Self-pay | Admitting: Certified Nurse Midwife

## 2021-07-28 DIAGNOSIS — Z3041 Encounter for surveillance of contraceptive pills: Secondary | ICD-10-CM

## 2021-08-25 ENCOUNTER — Other Ambulatory Visit: Payer: Self-pay | Admitting: Certified Nurse Midwife

## 2021-08-25 DIAGNOSIS — Z3041 Encounter for surveillance of contraceptive pills: Secondary | ICD-10-CM

## 2021-09-22 ENCOUNTER — Other Ambulatory Visit: Payer: Self-pay | Admitting: Certified Nurse Midwife

## 2021-09-23 ENCOUNTER — Other Ambulatory Visit: Payer: Self-pay | Admitting: Certified Nurse Midwife

## 2021-09-23 ENCOUNTER — Telehealth: Payer: Self-pay | Admitting: Certified Nurse Midwife

## 2021-09-23 NOTE — Telephone Encounter (Signed)
Holdover refill sent.

## 2021-09-23 NOTE — Telephone Encounter (Signed)
Pt called stating that she was told by her pharmacy that her request for refill on celexa was denied, pt stated that she has an upcoming appointment 10-22-2021. Please advise.

## 2021-10-20 ENCOUNTER — Other Ambulatory Visit: Payer: Self-pay | Admitting: Certified Nurse Midwife

## 2021-10-20 DIAGNOSIS — Z3041 Encounter for surveillance of contraceptive pills: Secondary | ICD-10-CM

## 2021-10-22 ENCOUNTER — Ambulatory Visit (INDEPENDENT_AMBULATORY_CARE_PROVIDER_SITE_OTHER): Payer: 59 | Admitting: Certified Nurse Midwife

## 2021-10-22 ENCOUNTER — Other Ambulatory Visit: Payer: Self-pay

## 2021-10-22 ENCOUNTER — Encounter: Payer: Self-pay | Admitting: Certified Nurse Midwife

## 2021-10-22 VITALS — BP 120/78 | HR 75 | Ht 66.0 in | Wt 287.9 lb

## 2021-10-22 DIAGNOSIS — Z01419 Encounter for gynecological examination (general) (routine) without abnormal findings: Secondary | ICD-10-CM

## 2021-10-22 DIAGNOSIS — Z3041 Encounter for surveillance of contraceptive pills: Secondary | ICD-10-CM | POA: Diagnosis not present

## 2021-10-22 MED ORDER — NORGESTIMATE-ETH ESTRADIOL 0.25-35 MG-MCG PO TABS: 1.0000 | ORAL_TABLET | Freq: Every day | ORAL | 11 refills | Status: DC

## 2021-10-22 NOTE — Progress Notes (Signed)
GYNECOLOGY ANNUAL PREVENTATIVE CARE ENCOUNTER NOTE  History:     Jacqueline Abbott is a 36 y.o. G71P2002 female here for a routine annual gynecologic exam.  Current complaints: bowel cramping used to happen prior to bowel movements, now occurs spurratically.   Denies abnormal vaginal bleeding, discharge, pelvic pain, problems with intercourse or other gynecologic concerns.     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 Contraception: OCP (estrogen/progesterone) Last Pap: 10/26/2018. Results were: normal with negative HPV Last mammogram: n/a.    The pregnancy intention screening data noted above was reviewed. Potential methods of contraception were discussed. The patient elected to proceed with OCP.   Obstetric History OB History  Gravida Para Term Preterm AB Living  2 2 2     2   SAB IAB Ectopic Multiple Live Births        0 2    # Outcome Date GA Lbr Len/2nd Weight Sex Delivery Anes PTL Lv  2 Term 12/07/19 [redacted]w[redacted]d 01:31 / 05:49 7 lb 14.8 oz (3.595 kg) M Vag-Vacuum EPI  LIV     Birth Comments: wnl  1 Term 2016 [redacted]w[redacted]d  8 lb (3.629 kg) F Vag-Spont EPI N LIV    Past Medical History:  Diagnosis Date   Anxiety    Hidradenitis    Left ovarian cyst    Obesity     Past Surgical History:  Procedure Laterality Date   CHOLECYSTECTOMY, LAPAROSCOPIC  2011   DILATION AND CURETTAGE OF UTERUS  09/2013   HYSTEROSCOPY  09/2013    Current Outpatient Medications on File Prior to Visit  Medication Sig Dispense Refill   citalopram (CELEXA) 20 MG tablet TAKE 1 TABLET BY MOUTH DAILY 30 tablet 6   NYMYO 0.25-35 MG-MCG tablet TAKE 1 TABLET BY MOUTH ONCE DAILY 28 tablet 1   No current facility-administered medications on file prior to visit.    Allergies  Allergen Reactions   Shellfish Allergy Swelling    Social  History:  reports that she quit smoking about 1 years ago. Her smoking use included cigarettes. She has never used smokeless tobacco. She reports that she does not drink alcohol and does not use drugs.  Family History  Problem Relation Age of Onset   Hypertension Maternal Grandmother    Heart disease Maternal Grandmother    Arthritis Father     The following portions of the patient's history were reviewed and updated as appropriate: allergies, current medications, past family history, past medical history, past social history, past surgical history and problem list.  Review of Systems Pertinent items noted in HPI and remainder of comprehensive ROS otherwise negative.  Physical Exam:  BP 120/78    Pulse 75    Ht 5\' 6"  (1.676 m)    Wt 287 lb 14.4 oz (130.6 kg)    LMP 10/17/2021 (Exact Date)    Breastfeeding Unknown    BMI 46.47 kg/m  CONSTITUTIONAL: Well-developed, well-nourished female in no acute distress.  HENT:  Normocephalic, atraumatic, External right and left ear normal. Oropharynx is clear and moist EYES: Conjunctivae and EOM are normal. Pupils are equal, round, and reactive to light. No scleral icterus.  NECK: Normal range of motion, supple, no masses.  Normal thyroid.  SKIN: Skin is warm and dry. No rash noted. Not diaphoretic. No erythema. No pallor. MUSCULOSKELETAL: Normal range of motion. No tenderness.  No cyanosis,  clubbing, or edema.  2+ distal pulses. NEUROLOGIC: Alert and oriented to person, place, and time. Normal reflexes, muscle tone coordination.  PSYCHIATRIC: Normal mood and affect. Normal behavior. Normal judgment and thought content. CARDIOVASCULAR: Normal heart rate noted, regular rhythm RESPIRATORY: Clear to auscultation bilaterally. Effort and breath sounds normal, no problems with respiration noted. BREASTS: Symmetric in size. No masses, tenderness, skin changes, nipple drainage, or lymphadenopathy bilaterally.  ABDOMEN: Soft, no distention noted.  No  tenderness, rebound or guarding.  PELVIC: Normal appearing external genitalia and urethral meatus; normal appearing vaginal mucosa and cervix.  No abnormal discharge noted.  Pap smear not due.   Normal uterine size, no other palpable masses, no uterine or adnexal tenderness.  .   Assessment and Plan:    1. Well woman exam with routine gynecological exam   Pap: not due until 2025 Mammogram : n/a  Labs: none Refills: OCP Referral: discussed referral to GI, she declines at this time. Encouraged pt to monitor diet to see if any specific foods triggers. She will let me know if she would like to have referral if it continues.  Routine preventative health maintenance measures emphasized. Please refer to After Visit Summary for other counseling recommendations.      Doreene Burke, CNM [facility],  [facility]

## 2022-07-29 ENCOUNTER — Encounter: Payer: Self-pay | Admitting: Certified Nurse Midwife

## 2022-07-29 ENCOUNTER — Other Ambulatory Visit: Payer: Self-pay | Admitting: Certified Nurse Midwife

## 2022-07-29 MED ORDER — MEDROXYPROGESTERONE ACETATE 150 MG/ML IM SUSP: 150.0000 mg | Freq: Once | INTRAMUSCULAR | 3 refills | Status: DC

## 2022-08-03 ENCOUNTER — Ambulatory Visit (INDEPENDENT_AMBULATORY_CARE_PROVIDER_SITE_OTHER): Payer: 59

## 2022-08-03 VITALS — BP 132/80 | HR 83 | Wt 294.1 lb

## 2022-08-03 DIAGNOSIS — Z3042 Encounter for surveillance of injectable contraceptive: Secondary | ICD-10-CM

## 2022-08-03 MED ORDER — MEDROXYPROGESTERONE ACETATE 150 MG/ML IM SUSP
150.0000 mg | Freq: Once | INTRAMUSCULAR | Status: AC
  Administered 2022-08-03: 150 mg via INTRAMUSCULAR

## 2022-08-03 NOTE — Progress Notes (Signed)
Date last pap: 10/26/18. Last Depo-Provera: n/a. Side Effects if any: n/a. Serum HCG indicated? no. Depo-Provera 150 mg IM given by: Cleophas Dunker, cma. Next appointment due 10/26/2022.

## 2022-10-28 ENCOUNTER — Encounter: Payer: Self-pay | Admitting: Certified Nurse Midwife

## 2022-10-28 ENCOUNTER — Ambulatory Visit (INDEPENDENT_AMBULATORY_CARE_PROVIDER_SITE_OTHER): Payer: 59 | Admitting: Certified Nurse Midwife

## 2022-10-28 VITALS — BP 120/76 | HR 78 | Resp 16 | Ht 66.0 in | Wt 292.0 lb

## 2022-10-28 DIAGNOSIS — Z3042 Encounter for surveillance of injectable contraceptive: Secondary | ICD-10-CM | POA: Diagnosis not present

## 2022-10-28 DIAGNOSIS — Z01419 Encounter for gynecological examination (general) (routine) without abnormal findings: Secondary | ICD-10-CM | POA: Diagnosis not present

## 2022-10-28 MED ORDER — MEDROXYPROGESTERONE ACETATE 150 MG/ML IM SUSP
150.0000 mg | Freq: Once | INTRAMUSCULAR | Status: AC
  Administered 2022-10-28: 150 mg via INTRAMUSCULAR

## 2022-10-28 MED ORDER — MEDROXYPROGESTERONE ACETATE 150 MG/ML IM SUSP: 150.0000 mg | Freq: Once | INTRAMUSCULAR | 3 refills | Status: DC

## 2022-10-28 NOTE — Patient Instructions (Signed)

## 2022-10-28 NOTE — Progress Notes (Signed)
GYNECOLOGY ANNUAL PREVENTATIVE CARE ENCOUNTER NOTE  History:     Jacqueline Abbott is a 37 y.o. G7P2002 female here for a routine annual gynecologic exam.  Current complaints: weight.   Denies abnormal vaginal bleeding, discharge, pelvic pain, problems with intercourse or other gynecologic concerns.     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 Contraception: Depo-Provera injections Last Pap: 10/27/2023. Results were: normal with negative HPV Last mammogram: n/a.    Upstream - 10/28/22 9326       Pregnancy Intention Screening   Does the patient want to become pregnant in the next year? No    Does the patient's partner want to become pregnant in the next year? No    Would the patient like to discuss contraceptive options today? No      Contraception Wrap Up   Current Method Hormonal Injection    End Method Hormonal Injection    Contraception Counseling Provided No            The pregnancy intention screening data noted above was reviewed. Potential methods of contraception were discussed. The patient elected to proceed with Hormonal Injection.   Obstetric History OB History  Gravida Para Term Preterm AB Living  2 2 2     2   SAB IAB Ectopic Multiple Live Births        0 2    # Outcome Date GA Lbr Len/2nd Weight Sex Delivery Anes PTL Lv  2 Term 12/07/19 [redacted]w[redacted]d 01:31 / 05:49 7 lb 14.8 oz (3.595 kg) M Vag-Vacuum EPI  LIV     Birth Comments: wnl  1 Term 2016 [redacted]w[redacted]d  8 lb (3.629 kg) F Vag-Spont EPI N LIV    Past Medical History:  Diagnosis Date   Anxiety    Hidradenitis    Left ovarian cyst    Obesity     Past Surgical History:  Procedure Laterality Date   CHOLECYSTECTOMY, LAPAROSCOPIC  2011   DILATION AND CURETTAGE OF UTERUS  09/2013   HYSTEROSCOPY  09/2013    Current  Outpatient Medications on File Prior to Visit  Medication Sig Dispense Refill   citalopram (CELEXA) 20 MG tablet TAKE 1 TABLET BY MOUTH DAILY 30 tablet 6   medroxyPROGESTERone (DEPO-PROVERA) 150 MG/ML injection Inject 1 mL (150 mg total) into the muscle once for 1 dose. 1 mL 3   No current facility-administered medications on file prior to visit.    Allergies  Allergen Reactions   Shellfish Allergy Swelling    Social History:  reports that she quit smoking about 2 years ago. Her smoking use included cigarettes. She has never used smokeless tobacco. She reports that she does not drink alcohol and does not use drugs.  Family History  Problem Relation Age of Onset   Hypertension Maternal Grandmother    Heart disease Maternal Grandmother    Arthritis Father     The following portions of the patient's history were reviewed and updated as appropriate: allergies, current medications, past family history, past medical history, past social history, past surgical history and problem list.  Review of Systems Pertinent items noted in HPI and remainder of comprehensive ROS otherwise negative.  Physical Exam:  BP 120/76   Pulse 78   Resp 16   Ht 5\' 6"  (1.676 m)   Wt 292 lb (132.5 kg)  LMP 09/28/2022 (Exact Date)   Breastfeeding No   BMI 47.13 kg/m  CONSTITUTIONAL: Well-developed, well-nourished female in no acute distress.  HENT:  Normocephalic, atraumatic, External right and left ear normal. Oropharynx is clear and moist EYES: Conjunctivae and EOM are normal. Pupils are equal, round, and reactive to light. No scleral icterus.  NECK: Normal range of motion, supple, no masses.  Normal thyroid.  SKIN: Skin is warm and dry. No rash noted. Not diaphoretic. No erythema. No pallor. MUSCULOSKELETAL: Normal range of motion. No tenderness.  No cyanosis, clubbing, or edema.  2+ distal pulses. NEUROLOGIC: Alert and oriented to person, place, and time. Normal reflexes, muscle tone coordination.   PSYCHIATRIC: Normal mood and affect. Normal behavior. Normal judgment and thought content. CARDIOVASCULAR: Normal heart rate noted, regular rhythm RESPIRATORY: Clear to auscultation bilaterally. Effort and breath sounds normal, no problems with respiration noted. BREASTS: Symmetric in size. No masses, tenderness, skin changes, nipple drainage, or lymphadenopathy bilaterally.  ABDOMEN: Soft, no distention noted.  No tenderness, rebound or guarding.  PELVIC: Normal appearing external genitalia and urethral meatus; normal appearing vaginal mucosa and cervix.  No abnormal discharge noted.  Pap smear obtained.  Normal uterine size, no other palpable masses, no uterine or adnexal tenderness.  .   Assessment and Plan:    1. Encounter for surveillance of injectable contraceptive - medroxyPROGESTERone (DEPO-PROVERA) injection 150 mg  2. Women's annual routine gynecological examination  Pap: not due until 2025 Mammogram : n/a  Labs: declines Refills: depo  Referral: none  Discussed use of app to track food and calorie in take. Discussed exercise for wt loss.  Routine preventative health maintenance measures emphasized. Please refer to After Visit Summary for other counseling recommendations.      Philip Aspen, [facility]  [facility]

## 2022-12-03 ENCOUNTER — Other Ambulatory Visit: Payer: Self-pay

## 2022-12-03 ENCOUNTER — Telehealth: Payer: Self-pay

## 2022-12-03 MED ORDER — CITALOPRAM HYDROBROMIDE 20 MG PO TABS: 20.0000 mg | ORAL_TABLET | Freq: Every day | ORAL | 6 refills | Status: DC

## 2022-12-03 NOTE — Telephone Encounter (Signed)
Patient was last seen in office for annual on 10/28/22, please review refill request. Jacqueline Abbott

## 2022-12-03 NOTE — Telephone Encounter (Signed)
Pharmacy called pt is there needing a refill on her Citalopram '20mg'$ . Pt states she is out of the medicine. Pharmacy states if you are to refill the best way would be a verbal, as the electronic system was hacked. Please advise if this can be refilled or does Pt need to be seen first. Pt was last seen 10/28/22

## 2022-12-03 NOTE — Telephone Encounter (Signed)
Refill called in for 1 year, left voice message to make Pt aware.

## 2023-01-20 ENCOUNTER — Ambulatory Visit (INDEPENDENT_AMBULATORY_CARE_PROVIDER_SITE_OTHER): Payer: 59

## 2023-01-20 VITALS — BP 111/76 | Ht 66.0 in | Wt 286.0 lb

## 2023-01-20 DIAGNOSIS — Z3042 Encounter for surveillance of injectable contraceptive: Secondary | ICD-10-CM | POA: Diagnosis not present

## 2023-01-20 MED ORDER — MEDROXYPROGESTERONE ACETATE 150 MG/ML IM SUSY
150.0000 mg | PREFILLED_SYRINGE | Freq: Once | INTRAMUSCULAR | Status: AC
  Administered 2023-01-20: 150 mg via INTRAMUSCULAR

## 2023-01-20 NOTE — Progress Notes (Addendum)
    NURSE VISIT NOTE  Subjective:    Patient ID: Jacqueline Abbott, female    DOB: 18-Mar-1986, 37 y.o.   MRN: [id number]  HPI  Patient is a 37 y.o. G76P2002 female who presents for depo provera injection.   Objective:    BP 111/76   Ht  (1.676 m)   Wt 286 lb (129.7 kg)   BMI 46.16 kg/m   Last Annual: 10/22/2021. Last pap: 10/26/2018. Last Depo-Provera:10/28/22. Side Effects if any: none. Serum HCG indicated? No . Depo-Provera 150 mg IM given by: Beverely Pace, CMA. Site: Right Deltoid  Assessment:   1. Encounter for Depo-Provera contraception      Plan:   Next appointment due between July 3-17     Loney Laurence, [place]

## 2023-01-21 NOTE — Addendum Note (Signed)
Addended by: Loney Laurence on: 01/21/2023 01:44 PM   Modules accepted: Level of Service

## 2023-04-14 ENCOUNTER — Ambulatory Visit (INDEPENDENT_AMBULATORY_CARE_PROVIDER_SITE_OTHER): Payer: 59

## 2023-04-14 VITALS — BP 106/68 | HR 80 | Ht 66.0 in | Wt 280.0 lb

## 2023-04-14 DIAGNOSIS — Z3042 Encounter for surveillance of injectable contraceptive: Secondary | ICD-10-CM | POA: Diagnosis not present

## 2023-04-14 MED ORDER — MEDROXYPROGESTERONE ACETATE 150 MG/ML IM SUSP
150.0000 mg | Freq: Once | INTRAMUSCULAR | Status: AC
  Administered 2023-04-14: 150 mg via INTRAMUSCULAR

## 2023-04-14 NOTE — Progress Notes (Signed)
    NURSE VISIT NOTE  Subjective:    Patient ID: Jacqueline Abbott, female    DOB: 02/14/1986, 37 y.o.   MRN: [id number]  HPI  Patient is a 37 y.o. G64P2002 female who presents for depo provera injection.   Objective:    BP 106/68   Pulse 80   Ht 5\' 6"  (1.676 m)   Wt 280 lb (127 kg)   LMP 09/28/2022   BMI 45.19 kg/m   Last Annual: 10/28/22. Last pap: 10/26/18. Last Depo-Provera: 01/20/23. Side Effects if any: none. Serum HCG indicated? No . Depo-Provera 150 mg IM given by: Cornelius Moras, CMA. Site: Right Deltoid    Assessment:   1. Surveillance for Depo-Provera contraception      Plan:   Next appointment due between 06/30/23 and 07/14/23.    Cornelius Moras, CMA

## 2023-04-14 NOTE — Patient Instructions (Signed)
Contraceptive Injection A contraceptive injection is a shot that prevents pregnancy. It is also called a birth control shot. The shot contains the hormone progestin, which prevents pregnancy by: Stopping the ovaries from releasing eggs. Thickening cervical mucus to prevent sperm from entering the cervix. Thinning the lining of the uterus to prevent a fertilized egg from attaching to the uterus. Contraceptive injections are given under the skin (subcutaneous) or into a muscle (intramuscular). For these shots to work, you must get one of them every 3 months (12-13 weeks) from a health care provider. Tell a health care provider about: Any allergies you have. All medicines you are taking, including vitamins, herbs, eye drops, creams, and over-the-counter medicines. Any blood disorders you have. Any medical conditions you have. Whether you are pregnant or may be pregnant. What are the risks? Generally, this is a safe procedure. However, problems may occur, including: Mood changes or depression. Loss of bone density (osteoporosis) after long-term use. Blood clots. This is rare. Higher risk of an egg being fertilized outside your uterus (ectopic pregnancy).This is rare. What happens before the procedure? Your health care provider may do a routine physical exam. You may have a test to make sure you are not pregnant. What happens during the procedure?  The area where the shot will be given will be cleaned and sanitized with alcohol. A needle will be inserted into a muscle in your upper arm or buttock, or into the skin of your thigh or abdomen. The needle will be attached to a syringe with the medicine inside of it. The medicine will be pushed through the syringe and injected into your body. A small bandage (dressing) may be placed over the injection site. What can I expect after the procedure? After the procedure, it is common to have: Soreness around the injection site for a couple of  days. Irregular menstrual bleeding. Weight gain. Breast tenderness. Headaches. Discomfort in your abdomen. Ask your health care provider whether you need to use an added method of birth control (backup contraception), such as a condom, sponge, or spermicide. If the first shot is given 1-7 days after the start of your last menstrual period, you will not need backup contraception. If the first shot is given at any other time during your menstrual cycle, you should avoid having sex. If you do have sex, you will need to use backup contraception for 7 days after you receive the shot. Follow these instructions at home: General instructions Take over-the-counter and prescription medicines only as told by your health care provider. Do not rub or massage the injection site. Track your menstrual periods so you will know if they become irregular. Always use a condom to protect against sexually transmitted infections (STIs). Make sure you schedule an appointment in time for your next shot and mark it on your calendar. You must get an injection every 3 months (12-13 weeks) to prevent pregnancy. Lifestyle Do not use any products that contain nicotine or tobacco. These products include cigarettes, chewing tobacco, and vaping devices, such as e-cigarettes. If you need help quitting, ask your health care provider. Eat foods that are high in calcium and vitamin D, such as milk, cheese, and salmon. Doing this may help with any loss in bone density caused by the contraceptive injection. Ask your health care provider for dietary recommendations. Contact a health care provider if you: Have nausea or vomiting. Have abnormal vaginal discharge or bleeding. Miss a menstrual period or think you might be pregnant. Experience mood changes   or depression. Feel dizzy or light-headed. Have leg pain. Get help right away if you: Have chest pain or cough up blood. Have shortness of breath. Have a severe headache that does  not go away. Have numbness in any part of your body. Have slurred speech or vision problems. Have vaginal bleeding that is abnormally heavy or does not stop, or you have severe pain in your abdomen. Have depression that does not get better with treatment. If you ever feel like you may hurt yourself or others, or have thoughts about taking your own life, get help right away. Go to your nearest emergency department or: Call your local emergency services (911 in the [place] Call a suicide crisis helpline, such as the National Suicide Prevention Lifeline at 1-800-273-8255 or 988 in the [place] This is open 24 hours a day in the [place] Text the Crisis Text Line at 741741 (in the [place] Summary A contraceptive injection is a shot that prevents pregnancy. It is also called the birth control shot. The shot is given under the skin (subcutaneous) or into a muscle (intramuscular). After this procedure, it is common to have soreness around the injection site for a couple of days. To prevent pregnancy, the shot must be given by a health care provider every 3 months (12-13 weeks). After you have the shot, ask your health care provider whether you need to use an added method of birth control (backup contraception), such as a condom, sponge, or spermicide. This information is not intended to replace advice given to you by your health care provider. Make sure you discuss any questions you have with your health care provider. Document Revised: 04/16/2021 Document Reviewed: 04/01/2020 Elsevier Patient Education  2024 [employer]  

## 2023-06-03 ENCOUNTER — Other Ambulatory Visit: Payer: Self-pay | Admitting: Certified Nurse Midwife

## 2023-06-03 ENCOUNTER — Encounter: Payer: Self-pay | Admitting: Certified Nurse Midwife

## 2023-06-03 MED ORDER — NICOTINE 7 MG/24HR TD PT24: 7.0000 mg | MEDICATED_PATCH | Freq: Every day | TRANSDERMAL | 0 refills | Status: AC

## 2023-06-03 MED ORDER — NICOTINE 21 MG/24HR TD PT24: 21.0000 mg | MEDICATED_PATCH | Freq: Every day | TRANSDERMAL | 1 refills | Status: AC

## 2023-06-03 MED ORDER — NICOTINE 14 MG/24HR TD PT24: 14.0000 mg | MEDICATED_PATCH | Freq: Every day | TRANSDERMAL | 0 refills | Status: AC

## 2023-06-03 NOTE — Progress Notes (Signed)
Pt requesting nicotine patch to quit smoking orders placed.   Doreene Burke, CNM

## 2023-07-07 ENCOUNTER — Ambulatory Visit: Payer: 59

## 2023-07-07 VITALS — BP 111/63 | HR 84 | Ht 66.0 in | Wt 275.8 lb

## 2023-07-07 DIAGNOSIS — Z3042 Encounter for surveillance of injectable contraceptive: Secondary | ICD-10-CM

## 2023-07-07 MED ORDER — MEDROXYPROGESTERONE ACETATE 150 MG/ML IM SUSY
150.0000 mg | PREFILLED_SYRINGE | Freq: Once | INTRAMUSCULAR | Status: AC
Start: 2023-07-07 — End: 2023-07-07
  Administered 2023-07-07: 150 mg via INTRAMUSCULAR

## 2023-07-07 NOTE — Progress Notes (Signed)
    NURSE VISIT NOTE  Subjective:    Patient ID: Jacqueline Abbott, female    DOB: [birth year], 37 y.o.   MRN: [id number]  HPI  Patient is a 37 y.o. G60P2002 female who presents for depo provera injection.   Objective:    BP 111/63   Pulse 84   Ht 5\' 6"  (1.676 m)   Wt 275 lb 12.8 oz (125.1 kg)   BMI 44.52 kg/m   Last Annual: 10/28/22 Last pap: 10/26/2018. Last Depo-Provera: 04/14/23 Side Effects if any: none. Serum HCG indicated? No . Depo-Provera 150 mg IM given by: Beverely Pace, CMA. Site: Right Deltoid    Assessment:   1. Encounter for Depo-Provera contraception      Plan:   Next appointment due between Dec 18-Jan1     Loney Laurence, [place]

## 2023-09-30 ENCOUNTER — Ambulatory Visit: Payer: 59

## 2023-09-30 VITALS — BP 130/80 | HR 111 | Ht 66.0 in | Wt 256.0 lb

## 2023-09-30 DIAGNOSIS — Z3042 Encounter for surveillance of injectable contraceptive: Secondary | ICD-10-CM

## 2023-09-30 MED ORDER — MEDROXYPROGESTERONE ACETATE 150 MG/ML IM SUSY
150.0000 mg | PREFILLED_SYRINGE | Freq: Once | INTRAMUSCULAR | Status: AC
Start: 2023-09-30 — End: 2023-09-30
  Administered 2023-09-30: 150 mg via INTRAMUSCULAR

## 2023-09-30 NOTE — Progress Notes (Signed)
    NURSE VISIT NOTE  Subjective:    Patient ID: Jacqueline Abbott, female    DOB: May 02, 1986, 37 y.o.   MRN: [id number]  HPI  Patient is a 37 y.o. G37P2002 female who presents for depo provera injection.   Objective:    BP 130/80   Pulse (!) 111   Ht 5\' 6"  (1.676 m)   Wt 256 lb (116.1 kg)   LMP  (LMP Unknown)   BMI 41.32 kg/m   Last Annual: 10/28/22. Last pap: 10/26/18. Last Depo-Provera: 07/07/23. Side Effects if any: none. Serum HCG indicated? No . Depo-Provera 150 mg IM given by: Beverely Pace, CMA. Site: Left Deltoid  Assessment:   1. Surveillance for Depo-Provera contraception      Plan:   Next appointment due between March 13 and 27.    Loney Laurence, CMA

## 2023-12-07 ENCOUNTER — Other Ambulatory Visit: Payer: Self-pay | Admitting: Certified Nurse Midwife

## 2023-12-23 ENCOUNTER — Ambulatory Visit: Payer: 59

## 2023-12-24 ENCOUNTER — Ambulatory Visit

## 2023-12-24 DIAGNOSIS — Z3042 Encounter for surveillance of injectable contraceptive: Secondary | ICD-10-CM

## 2023-12-27 ENCOUNTER — Ambulatory Visit (INDEPENDENT_AMBULATORY_CARE_PROVIDER_SITE_OTHER)

## 2023-12-27 VITALS — BP 114/68 | HR 77 | Wt 253.1 lb

## 2023-12-27 DIAGNOSIS — Z3042 Encounter for surveillance of injectable contraceptive: Secondary | ICD-10-CM | POA: Diagnosis not present

## 2023-12-27 MED ORDER — MEDROXYPROGESTERONE ACETATE 150 MG/ML IM SUSY
150.0000 mg | PREFILLED_SYRINGE | Freq: Once | INTRAMUSCULAR | Status: AC
Start: 2023-12-27 — End: 2023-12-27
  Administered 2023-12-27: 150 mg via INTRAMUSCULAR

## 2023-12-27 NOTE — Progress Notes (Signed)
    NURSE VISIT NOTE  Subjective:    Patient ID: Jacqueline Abbott, female    DOB: 03/02/1986, 38 y.o.   MRN: [id number]  HPI  Patient is a 38 y.o. G64P2002 female who presents for depo provera injection.   Objective:    BP 114/68 (BP Location: Left Arm, Patient Position: Sitting, Cuff Size: Large)   Pulse 77   Wt 253 lb 1.6 oz (114.8 kg)   BMI 40.85 kg/m   Last Annual: 10/28/2022. Last pap: 10/26/2018. Last Depo-Provera: 09/30/2023. Side Effects if any: none. Serum HCG indicated? No . Depo-Provera 150 mg IM given by: Doristine Devoid, CMA. Site: Right Deltoid  Lab Review  @THIS  VISIT ONLY@  Assessment:   1. Surveillance for Depo-Provera contraception      Plan:   Next appointment due between June 9th  and June 23rd.    Burtis Junes, CMA

## 2024-01-31 ENCOUNTER — Other Ambulatory Visit: Payer: Self-pay | Admitting: Certified Nurse Midwife

## 2024-02-02 ENCOUNTER — Ambulatory Visit (INDEPENDENT_AMBULATORY_CARE_PROVIDER_SITE_OTHER): Admitting: Certified Nurse Midwife

## 2024-02-02 ENCOUNTER — Encounter: Payer: Self-pay | Admitting: Certified Nurse Midwife

## 2024-02-02 ENCOUNTER — Other Ambulatory Visit (HOSPITAL_COMMUNITY)
Admission: RE | Admit: 2024-02-02 | Discharge: 2024-02-02 | Disposition: A | Discharge status: Home / Self Care | Source: Ambulatory Visit | Attending: Certified Nurse Midwife | Admitting: Certified Nurse Midwife

## 2024-02-02 VITALS — BP 112/70 | HR 79 | Ht 66.0 in | Wt 259.9 lb

## 2024-02-02 DIAGNOSIS — Z124 Encounter for screening for malignant neoplasm of cervix: Secondary | ICD-10-CM

## 2024-02-02 DIAGNOSIS — Z01419 Encounter for gynecological examination (general) (routine) without abnormal findings: Secondary | ICD-10-CM | POA: Diagnosis present

## 2024-02-02 MED ORDER — CITALOPRAM HYDROBROMIDE 20 MG PO TABS: 20.0000 mg | ORAL_TABLET | Freq: Every day | ORAL | 3 refills | Status: AC

## 2024-02-02 MED ORDER — MEDROXYPROGESTERONE ACETATE 150 MG/ML IM SUSP: 150.0000 mg | Freq: Once | INTRAMUSCULAR | 3 refills | Status: DC

## 2024-02-02 NOTE — Patient Instructions (Signed)

## 2024-02-02 NOTE — Progress Notes (Signed)
 See other note

## 2024-02-02 NOTE — Progress Notes (Signed)
 GYNECOLOGY ANNUAL PREVENTATIVE CARE ENCOUNTER NOTE  History:     Jacqueline Abbott is a 38 y.o. G24P2002 female here for a routine annual gynecologic exam.  Current complaints: none.   Denies abnormal vaginal bleeding, discharge, pelvic pain, problems with intercourse or other gynecologic concerns.     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 Patient has had an injection. Contraception: Depo-Provera  injections Last Pap: 10/26/2018. Results were: normal with negative HPV Last mammogram: n/a.   Upstream - 02/02/24 4401       Pregnancy Intention Screening   Does the patient want to become pregnant in the next year? No    Does the patient's partner want to become pregnant in the next year? No    Would the patient like to discuss contraceptive options today? No      Contraception Wrap Up   Current Method Hormonal Injection    End Method Hormonal Injection    Contraception Counseling Provided No            The pregnancy intention screening data noted above was reviewed. Potential methods of contraception were discussed. The patient elected to proceed with Hormonal Injection.   Obstetric History OB History  Gravida Para Term Preterm AB Living  2 2 2   2   SAB IAB Ectopic Multiple Live Births     0 2    # Outcome Date GA Lbr Len/2nd Weight Sex Type Anes PTL Lv  2 Term 12/07/19 [redacted]w[redacted]d 01:31 / 05:49 7 lb 14.8 oz (3.595 kg) M Vag-Vacuum EPI  LIV     Birth Comments: wnl  1 Term 2016 [redacted]w[redacted]d  8 lb (3.629 kg) F Vag-Spont EPI N LIV    Past Medical History:  Diagnosis Date   Anxiety    Hidradenitis    Left ovarian cyst    Obesity     Past Surgical History:  Procedure Laterality Date   CHOLECYSTECTOMY, LAPAROSCOPIC  2011   DILATION AND CURETTAGE OF UTERUS  09/2013   HYSTEROSCOPY  09/2013    Current Outpatient Medications on  File Prior to Visit  Medication Sig Dispense Refill   citalopram  (CELEXA ) 20 MG tablet TAKE 1 TABLET BY MOUTH DAILY 30 tablet 0   nicotine  (NICODERM CQ  - DOSED IN MG/24 HOURS) 14 mg/24hr patch Place 1 patch (14 mg total) onto the skin daily. (Step 2) , start after completion for first 6 weeks at 21 mg/day 28 patch 0   nicotine  (NICODERM CQ  - DOSED IN MG/24 HOURS) 21 mg/24hr patch Place 1 patch (21 mg total) onto the skin daily. 1 patch daily for 6 weeks (step 1) . 28 patch 1   nicotine  (NICODERM CQ  - DOSED IN MG/24 HR) 7 mg/24hr patch Place 1 patch (7 mg total) onto the skin daily. (Step 3) start after completing of 6 wks @ 14 mg/day 28 patch 0   medroxyPROGESTERone  (DEPO-PROVERA ) 150 MG/ML injection Inject 1 mL (150 mg total) into the muscle once for 1 dose. 1 mL 3   No current facility-administered medications on file prior to visit.    Allergies  Allergen Reactions   Shellfish Allergy Swelling    Social History:  reports that she quit smoking about 4 years ago. Her smoking use included cigarettes. She has never used smokeless tobacco. She reports that she does not drink alcohol and does not use  drugs.  Family History  Problem Relation Age of Onset   Hypertension Maternal Grandmother    Heart disease Maternal Grandmother    Arthritis Father     The following portions of the patient's history were reviewed and updated as appropriate: allergies, current medications, past family history, past medical history, past social history, past surgical history and problem list.  Review of Systems Pertinent items noted in HPI and remainder of comprehensive ROS otherwise negative.  Physical Exam:  There were no vitals taken for this visit. CONSTITUTIONAL: Well-developed, well-nourished female in no acute distress.  HENT:  Normocephalic, atraumatic, External right and left ear normal. Oropharynx is clear and moist EYES: Conjunctivae and EOM are normal. Pupils are equal, round, and reactive to  light. No scleral icterus.  NECK: Normal range of motion, supple, no masses.  Normal thyroid .  SKIN: Skin is warm and dry. No rash noted. Not diaphoretic. No erythema. No pallor. MUSCULOSKELETAL: Normal range of motion. No tenderness.  No cyanosis, clubbing, or edema.  2+ distal pulses. NEUROLOGIC: Alert and oriented to person, place, and time. Normal reflexes, muscle tone coordination.  PSYCHIATRIC: Normal mood and affect. Normal behavior. Normal judgment and thought content. CARDIOVASCULAR: Normal heart rate noted, regular rhythm RESPIRATORY: Clear to auscultation bilaterally. Effort and breath sounds normal, no problems with respiration noted. BREASTS: Symmetric in size. No masses, tenderness, skin changes, nipple drainage, or lymphadenopathy bilaterally.  ABDOMEN: Soft, no distention noted.  No tenderness, rebound or guarding.  PELVIC: Normal appearing external genitalia and urethral meatus; normal appearing vaginal mucosa and cervix.  No abnormal discharge noted.  Pap smear obtained.  Normal uterine size, no other palpable masses, no uterine or adnexal tenderness.  .   Assessment and Plan:    1. Women's annual routine gynecological examination (Primary)  Pap:Will follow up results of pap smear and manage accordingly. Mammogram : n/a Labs: cbc, cmp, lipid profile, tsh, hemoglobin A1c Refills: citalopram  , depo provera  Referral: none  Routine preventative health maintenance measures emphasized. Please refer to After Visit Summary for other counseling recommendations.      Alise Appl, CNM [facility]  [facility],  [facility]

## 2024-02-03 LAB — CBC
Hematocrit: 38.3 % (ref 34.0–46.6)
Hemoglobin: 13 g/dL (ref 11.1–15.9)
MCH: 30.7 pg (ref 26.6–33.0)
MCHC: 33.9 g/dL (ref 31.5–35.7)
MCV: 90 fL (ref 79–97)
Platelets: 271 10*3/uL (ref 150–450)
RBC: 4.24 x10E6/uL (ref 3.77–5.28)
RDW: 12.8 % (ref 11.7–15.4)
WBC: 5.5 10*3/uL (ref 3.4–10.8)

## 2024-02-03 LAB — LIPID PANEL
Chol/HDL Ratio: 2.8 ratio (ref 0.0–4.4)
Cholesterol, Total: 161 mg/dL (ref 100–199)
HDL: 58 mg/dL (ref >39)
LDL Chol Calc (NIH): 91 mg/dL (ref 0–99)
Triglycerides: 62 mg/dL (ref 0–149)
VLDL Cholesterol Cal: 12 mg/dL (ref 5–40)

## 2024-02-03 LAB — COMPREHENSIVE METABOLIC PANEL WITH GFR
ALT: 38 IU/L — ABNORMAL HIGH (ref 0–32)
AST: 22 IU/L (ref 0–40)
Albumin: 4.2 g/dL (ref 3.9–4.9)
Alkaline Phosphatase: 99 IU/L (ref 44–121)
BUN/Creatinine Ratio: 14 (ref 9–23)
BUN: 11 mg/dL (ref 6–20)
Bilirubin Total: 0.4 mg/dL (ref 0.0–1.2)
CO2: 19 mmol/L — ABNORMAL LOW (ref 20–29)
Calcium: 9.3 mg/dL (ref 8.7–10.2)
Chloride: 104 mmol/L (ref 96–106)
Creatinine, Ser: 0.76 mg/dL (ref 0.57–1.00)
Globulin, Total: 2.5 g/dL (ref 1.5–4.5)
Glucose: 77 mg/dL (ref 70–99)
Potassium: 4.3 mmol/L (ref 3.5–5.2)
Sodium: 136 mmol/L (ref 134–144)
Total Protein: 6.7 g/dL (ref 6.0–8.5)
eGFR: 103 mL/min/{1.73_m2} (ref >59)

## 2024-02-03 LAB — HEMOGLOBIN A1C
Est. average glucose Bld gHb Est-mCnc: 105 mg/dL
Hgb A1c MFr Bld: 5.3 % (ref 4.8–5.6)

## 2024-02-03 LAB — TSH+FREE T4
Free T4: 1.16 ng/dL (ref 0.82–1.77)
TSH: 1.32 u[IU]/mL (ref 0.450–4.500)

## 2024-02-07 LAB — CYTOLOGY - PAP
Comment: NEGATIVE
Diagnosis: NEGATIVE
High risk HPV: NEGATIVE

## 2024-03-20 ENCOUNTER — Ambulatory Visit

## 2024-03-21 ENCOUNTER — Ambulatory Visit (INDEPENDENT_AMBULATORY_CARE_PROVIDER_SITE_OTHER)

## 2024-03-21 VITALS — BP 103/66 | HR 81 | Ht 66.0 in | Wt 270.9 lb

## 2024-03-21 DIAGNOSIS — Z3042 Encounter for surveillance of injectable contraceptive: Secondary | ICD-10-CM | POA: Diagnosis not present

## 2024-03-21 MED ORDER — MEDROXYPROGESTERONE ACETATE 150 MG/ML IM SUSP
150.0000 mg | Freq: Once | INTRAMUSCULAR | Status: AC
Start: 2024-03-21 — End: 2024-03-21
  Administered 2024-03-21: 150 mg via INTRAMUSCULAR

## 2024-03-21 NOTE — Patient Instructions (Signed)

## 2024-03-21 NOTE — Progress Notes (Signed)
    NURSE VISIT NOTE  Subjective:    Patient ID: Jacqueline Abbott, female    DOB: 01-19-1986, 38 y.o.   MRN: [id number]  HPI  Patient is a 38 y.o. G57P2002 female who presents for depo provera  injection.   Objective:    BP 103/66   Pulse 81   Ht 5' 6 (1.676 m)   Wt 270 lb 14.4 oz (122.9 kg)   BMI 43.72 kg/m   Last Annual: 02/02/2024. Last pap: 02/02/2024. Last Depo-Provera : 12/27/2023. Side Effects if any: none. Serum HCG indicated? No . Depo-Provera  150 mg IM given by: Venetta Gill, CMA. Site: Left Deltoid  Lab Review  No results found for any visits on 03/21/24.  Assessment:   1. Surveillance for Depo-Provera  contraception      Plan:   Next appointment due between 06/06/2024 and 06/20/2024.    Inga Manges, CMA

## 2024-06-12 NOTE — Progress Notes (Unsigned)
    NURSE VISIT NOTE  Subjective:    Patient ID: Jacqueline Abbott, female    DOB: [birth year], 38 y.o.   MRN: [id number]  HPI  Patient is a 38 y.o. G85P2002 female who presents for depo provera  injection.   Objective:    There were no vitals taken for this visit.  Last Annual: 02/02/2024. Last pap: 02/02/2024. Last Depo-Provera : 03/21/2024. Side Effects if any: none. Serum HCG indicated? No . Depo-Provera  150 mg IM given by: Camelia Fetters, CMA. Site: Right Deltoid  Lab Review  No results found for any visits on 06/13/24.  Assessment:   1. Surveillance for Depo-Provera  contraception      Plan:   Next appointment due between Nov. 24 and Dec. 8    Camelia Fetters, CMA [facility]

## 2024-06-12 NOTE — Patient Instructions (Signed)

## 2024-06-13 ENCOUNTER — Ambulatory Visit (INDEPENDENT_AMBULATORY_CARE_PROVIDER_SITE_OTHER)

## 2024-06-13 VITALS — BP 111/58 | HR 76 | Ht 66.0 in | Wt 278.3 lb

## 2024-06-13 DIAGNOSIS — Z3042 Encounter for surveillance of injectable contraceptive: Secondary | ICD-10-CM | POA: Diagnosis not present

## 2024-06-13 MED ORDER — MEDROXYPROGESTERONE ACETATE 150 MG/ML IM SUSP
150.0000 mg | Freq: Once | INTRAMUSCULAR | Status: AC
  Administered 2024-06-13: 150 mg via INTRAMUSCULAR

## 2024-08-28 NOTE — Progress Notes (Unsigned)
    NURSE VISIT NOTE  Subjective:    Patient ID: Alan DELENA Liverpool, female    DOB: 1986-01-01, 38 y.o.   MRN: [id number]  HPI  Patient is a 38 y.o. G59P2002 female who presents for depo provera  injection.   Objective:    There were no vitals taken for this visit.  Last Annual: 02/02/24. Last pap: 02/02/24. Last Depo-Provera : 06/13/24. Side Effects if any: none. Serum HCG indicated? No . Depo-Provera  150 mg IM given by: Mathis Getting, CMA. Site: {AOB INJ [id number]  Lab Review  No results found for any visits on 08/29/24.  Assessment:   1. Encounter for management and injection of depo-Provera       Plan:   Next appointment due between 11/14/24 and 11/28/24.    Mathis LITTIE Getting, CMA

## 2024-08-28 NOTE — Patient Instructions (Signed)

## 2024-08-29 ENCOUNTER — Ambulatory Visit (INDEPENDENT_AMBULATORY_CARE_PROVIDER_SITE_OTHER)

## 2024-08-29 VITALS — BP 109/70 | HR 80 | Wt 287.9 lb

## 2024-08-29 DIAGNOSIS — Z3042 Encounter for surveillance of injectable contraceptive: Secondary | ICD-10-CM

## 2024-08-29 MED ORDER — MEDROXYPROGESTERONE ACETATE 150 MG/ML IM SUSP
150.0000 mg | Freq: Once | INTRAMUSCULAR | Status: AC
  Administered 2024-08-29: 150 mg via INTRAMUSCULAR

## 2024-11-14 ENCOUNTER — Ambulatory Visit
